# Patient Record
Sex: Female | Born: 1985 | Hispanic: No | Marital: Married | Smoking: Never smoker
Health system: Southern US, Community
[De-identification: ages and names within clinical notes are randomized; demographics above are authoritative.]

## PROBLEM LIST (undated history)

## (undated) ENCOUNTER — Inpatient Hospital Stay (HOSPITAL_COMMUNITY): Payer: Self-pay

## (undated) DIAGNOSIS — E0590 Thyrotoxicosis, unspecified without thyrotoxic crisis or storm: Secondary | ICD-10-CM

## (undated) HISTORY — PX: NO PAST SURGERIES: SHX2092

---

## 2017-12-12 NOTE — L&D Delivery Note (Signed)
[name] is a 32 y.o. female G2P1001 with IUP at 42w6dadmitted for IOL for polyhydramnios.  She progressed with/without augmentation to complete and pushed 30 minutes to deliver.  Cord clamping delayed by several minutes then clamped by CNM and cut by CNM.    Delivery Note At 10:09 PM a viable female was delivered via Vaginal, Spontaneous (Presentation: LOA).  APGAR: 9, 9; weight pending.   Placenta status: spontaneous, intact.  Cord: 3 vessels.  Anesthesia:  fentanyl Episiotomy: None Lacerations:  2nd degree Suture Repair: 2.0 vicryl Est. Blood Loss (mL): 200  Mom to postpartum.  Baby to Couplet care / Skin to Skin.  CWende MottCNM 12/01/2018, 10:51 PM

## 2018-04-18 ENCOUNTER — Ambulatory Visit (INDEPENDENT_AMBULATORY_CARE_PROVIDER_SITE_OTHER): Payer: Medicaid Other

## 2018-04-18 ENCOUNTER — Encounter: Payer: Self-pay | Admitting: Family Medicine

## 2018-04-18 DIAGNOSIS — Z3201 Encounter for pregnancy test, result positive: Secondary | ICD-10-CM | POA: Diagnosis not present

## 2018-04-18 LAB — POCT PREGNANCY, URINE: Preg Test, Ur: POSITIVE — AB

## 2018-04-18 NOTE — Progress Notes (Signed)
I have reviewed the chart and agree with nursing staff's documentation of this patient's encounter.  Vonzella Nipple, PA-C 04/18/2018 1:20 PM

## 2018-04-18 NOTE — Progress Notes (Signed)
Pt presented to the office for a UPT. UPT positive. Pt states that LMP is 03/08/18. Pt is 5w 6d EDD 12/13/18. Pt advised to start prenatal vitamins and prenatal care.

## 2018-05-10 ENCOUNTER — Encounter (HOSPITAL_COMMUNITY): Payer: Self-pay | Admitting: *Deleted

## 2018-05-10 ENCOUNTER — Inpatient Hospital Stay (HOSPITAL_COMMUNITY)
Admission: AD | Admit: 2018-05-10 | Discharge: 2018-05-10 | Disposition: A | Discharge status: Home / Self Care | Payer: Medicaid Other | Source: Ambulatory Visit | Attending: Obstetrics and Gynecology | Admitting: Obstetrics and Gynecology

## 2018-05-10 DIAGNOSIS — O99281 Endocrine, nutritional and metabolic diseases complicating pregnancy, first trimester: Secondary | ICD-10-CM | POA: Diagnosis not present

## 2018-05-10 DIAGNOSIS — Z3A01 Less than 8 weeks gestation of pregnancy: Secondary | ICD-10-CM | POA: Insufficient documentation

## 2018-05-10 DIAGNOSIS — E039 Hypothyroidism, unspecified: Secondary | ICD-10-CM | POA: Insufficient documentation

## 2018-05-10 DIAGNOSIS — O209 Hemorrhage in early pregnancy, unspecified: Secondary | ICD-10-CM | POA: Diagnosis not present

## 2018-05-10 DIAGNOSIS — Z79890 Hormone replacement therapy (postmenopausal): Secondary | ICD-10-CM | POA: Insufficient documentation

## 2018-05-10 DIAGNOSIS — N939 Abnormal uterine and vaginal bleeding, unspecified: Secondary | ICD-10-CM | POA: Diagnosis present

## 2018-05-10 LAB — URINALYSIS, ROUTINE W REFLEX MICROSCOPIC
Bilirubin Urine: NEGATIVE
Glucose, UA: NEGATIVE mg/dL
KETONES UR: NEGATIVE mg/dL
Leukocytes, UA: NEGATIVE
Nitrite: NEGATIVE
PH: 6 (ref 5.0–8.0)
Protein, ur: NEGATIVE mg/dL
SPECIFIC GRAVITY, URINE: 1.011 (ref 1.005–1.030)

## 2018-05-10 LAB — WET PREP, GENITAL
CLUE CELLS WET PREP: NONE SEEN
SPERM: NONE SEEN
TRICH WET PREP: NONE SEEN
YEAST WET PREP: NONE SEEN

## 2018-05-10 LAB — ABO/RH: ABO/RH(D): B POS

## 2018-05-10 MED ORDER — ONDANSETRON HCL 4 MG PO TABS: 4.0000 mg | ORAL_TABLET | Freq: Three times a day (TID) | ORAL | 0 refills | Status: DC | PRN

## 2018-05-10 NOTE — MAU Provider Note (Addendum)
Patient Heather Galloway is a 32 y.o. G2P1001 At 4w0dhere with complaints of vaginal bleeding and right-sided lower quadrant pain. She denies dysuria, abnormal discharge, constipation or other complaint. [facility] [place] an interpreter was used.   History     CSN: [id number] Arrival date and time: 05/10/18 08119  First Provider Initiated Contact with Patient 05/10/18 0912      Chief Complaint  Patient presents with  . Vaginal Bleeding   Abdominal Pain  This is a new problem. The current episode started 1 to 4 weeks ago. Episode frequency: it occurs in the morning when she wakes up. The problem has been resolved. The pain is located in the RLQ. The pain is at a severity of 4/10. The quality of the pain is cramping. The abdominal pain does not radiate. Associated symptoms include vomiting. Pertinent negatives include no constipation or diarrhea. Nothing aggravates the pain. The pain is relieved by nothing.  Vaginal Bleeding  The patient's primary symptoms include vaginal bleeding. The patient's pertinent negatives include no genital itching or genital lesions. This is a new problem. The current episode started today. The problem occurs intermittently. The problem has been resolved. Associated symptoms include abdominal pain and vomiting. Pertinent negatives include no constipation or diarrhea. The vaginal bleeding is lighter than menses. She has not been passing clots. She has not been passing tissue. Nothing aggravates the symptoms. She has tried nothing for the symptoms.    OB History    Gravida  2   Para  1   Term  1   Preterm      AB      Living  1     SAB      TAB      Ectopic      Multiple      Live Births              Past Medical History:  Diagnosis Date  . Hypothyroidism     Past Surgical History:  Procedure Laterality Date  . NO PAST SURGERIES      History reviewed. No  pertinent family history.  Social History   Tobacco Use  . Smoking status: Never Smoker  . Smokeless tobacco: Never Used  Substance Use Topics  . Alcohol use: Never    Frequency: Never  . Drug use: Never    Allergies: No Known Allergies  Medications Prior to Admission  Medication Sig Dispense Refill Last Dose  . levothyroxine (SYNTHROID, LEVOTHROID) 100 MCG tablet Take 100 mcg by mouth daily before breakfast.   Past Week at Unknown time    Review of Systems  HENT: Negative.   Respiratory: Negative.   Gastrointestinal: Positive for abdominal pain and vomiting. Negative for constipation and diarrhea.  Genitourinary: Positive for vaginal bleeding.  Musculoskeletal: Negative.   Skin: Negative.   Neurological: Negative.   Psychiatric/Behavioral: Negative.    Physical Exam   Blood pressure 123/72, pulse 70, temperature 98.3 F (36.8 C), temperature source Oral, resp. rate 18, weight 150 lb (68 kg), last menstrual period 03/08/2018.  Physical Exam  Constitutional: She is oriented to person, place, and time. She appears well-developed.  HENT:  Head: Normocephalic.  GI: Soft.  Genitourinary:  Genitourinary Comments: Normal external female genitalia; no discharge in the vagina. Cervix is closed and poseterior; no CMT, suprapubic or adnexal tenderness.   Musculoskeletal: Normal range of motion.  Neurological: She is  alert and oriented to person, place, and time.  Skin: Skin is warm and dry.     MAU Course  Procedures  MDM HR by doppler is 172.  Bleeding scant, not concerning for miscarriage. CBC not done.  ABO workup-B positive.   Assessment and Plan   1. Bleeding in early pregnancy    2. Patient stable for discharge with recommendation to keep prenatal visit in June.  3. Bleeding precautions reviewed.  5. Reassured patient of normalcy of bleeding in early pregnancy, but she should return to MAU if bleeding becomes much heavier or if her pain worsens or changes.   6. Zofran RX given as patient has been throwing up her synthroid in the morning.    Mervyn Skeeters Kooistra 05/10/2018, 9:12 AM

## 2018-05-10 NOTE — MAU Note (Signed)
Pt reports she started having vaginal bleeding this morning.c/o  Pain in her right groin area.

## 2018-05-10 NOTE — Discharge Instructions (Signed)
Vaginal Bleeding During Pregnancy, First Trimester °A small amount of bleeding (spotting) from the vagina is common in early pregnancy. Sometimes the bleeding is normal and is not a problem, and sometimes it is a sign of something serious. Be sure to tell your doctor about any bleeding from your vagina right away. °Follow these instructions at home: °· Watch your condition for any changes. °· Follow your doctor's instructions about how active you can be. °· If you are on bed rest: °? You may need to stay in bed and only get up to use the bathroom. °? You may be allowed to do some activities. °? If you need help, make plans for someone to help you. °· Write down: °? The number of pads you use each day. °? How often you change pads. °? How soaked (saturated) your pads are. °· Do not use tampons. °· Do not douche. °· Do not have sex or orgasms until your doctor says it is okay. °· If you pass any tissue from your vagina, save the tissue so you can show it to your doctor. °· Only take medicines as told by your doctor. °· Do not take aspirin because it can make you bleed. °· Keep all follow-up visits as told by your doctor. °Contact a doctor if: °· You bleed from your vagina. °· You have cramps. °· You have labor pains. °· You have a fever that does not go away after you take medicine. °Get help right away if: °· You have very bad cramps in your back or belly (abdomen). °· You pass large clots or tissue from your vagina. °· You bleed more. °· You feel light-headed or weak. °· You pass out (faint). °· You have chills. °· You are leaking fluid or have a gush of fluid from your vagina. °· You pass out while pooping (having a bowel movement). °This information is not intended to replace advice given to you by your health care provider. Make sure you discuss any questions you have with your health care provider. °Document Released: 04/14/2014 Document Revised: 05/05/2016 Document Reviewed: 08/05/2013 °Elsevier Interactive  Patient Education © 2018 Elsevier Inc. ° °

## 2018-05-11 LAB — GC/CHLAMYDIA PROBE AMP (~~LOC~~) NOT AT ARMC
Chlamydia: NEGATIVE
NEISSERIA GONORRHEA: NEGATIVE

## 2018-06-08 ENCOUNTER — Other Ambulatory Visit: Payer: Self-pay

## 2018-06-08 ENCOUNTER — Encounter: Payer: Self-pay | Admitting: Nurse Practitioner

## 2018-06-08 ENCOUNTER — Other Ambulatory Visit (HOSPITAL_COMMUNITY)
Admission: RE | Admit: 2018-06-08 | Discharge: 2018-06-08 | Disposition: A | Discharge status: Home / Self Care | Payer: Medicaid Other | Source: Ambulatory Visit | Attending: Nurse Practitioner | Admitting: Nurse Practitioner

## 2018-06-08 ENCOUNTER — Ambulatory Visit (INDEPENDENT_AMBULATORY_CARE_PROVIDER_SITE_OTHER): Payer: Medicaid Other | Admitting: Nurse Practitioner

## 2018-06-08 VITALS — BP 104/74 | HR 81 | Ht 61.81 in | Wt 149.0 lb

## 2018-06-08 DIAGNOSIS — Z3480 Encounter for supervision of other normal pregnancy, unspecified trimester: Secondary | ICD-10-CM | POA: Insufficient documentation

## 2018-06-08 DIAGNOSIS — Z3A00 Weeks of gestation of pregnancy not specified: Secondary | ICD-10-CM | POA: Insufficient documentation

## 2018-06-08 DIAGNOSIS — O219 Vomiting of pregnancy, unspecified: Secondary | ICD-10-CM

## 2018-06-08 DIAGNOSIS — Z3201 Encounter for pregnancy test, result positive: Secondary | ICD-10-CM

## 2018-06-08 DIAGNOSIS — Z789 Other specified health status: Secondary | ICD-10-CM

## 2018-06-08 DIAGNOSIS — Z3481 Encounter for supervision of other normal pregnancy, first trimester: Secondary | ICD-10-CM

## 2018-06-08 DIAGNOSIS — E0590 Thyrotoxicosis, unspecified without thyrotoxic crisis or storm: Secondary | ICD-10-CM

## 2018-06-08 MED ORDER — ONDANSETRON 8 MG PO TBDP: 8.0000 mg | ORAL_TABLET | Freq: Three times a day (TID) | ORAL | 1 refills | Status: DC | PRN

## 2018-06-08 NOTE — Patient Instructions (Addendum)
Miralax for having a BM every day.  Use it every day if you are taking the Zofran.  Take Tylenol 325 mg 2 tablets by mouth every 4 hours if needed for pain. Drink at least 8 8-oz glasses of water every day.     BENEFITS OF BREASTFEEDING Many women wonder if they should breastfeed. Research shows that breast milk contains the perfect balance of vitamins, protein and fat that your baby needs to grow. It also contains antibodies that help your baby's immune system to fight off viruses and bacteria and can reduce the risk of sudden infant death syndrome (SIDS). In addition, the colostrum (a fluid secreted from the breast in the first few days after delivery) helps your newborn's digestive system to grow and function well. Breast milk is easier to digest than formula. Also, if your baby is born preterm, breast milk can help to reduce both short- and long-term health problems. BENEFITS OF BREASTFEEDING FOR MOM . Breastfeeding causes a hormone to be released that helps the uterus to contract and return to its normal size more quickly. . It aids in postpartum weight loss, reduces risk of breast and ovarian cancer, heart disease and rheumatoid arthritis. . It decreases the amount of bleeding after the baby is born. benefits of breastfeeding for baby . Provides comfort and nutrition . Protects baby against - Obesity - Diabetes - Asthma - Childhood cancers - Heart disease - Ear infections - Diarrhea - Pneumonia - Stomach problems - Serious allergies - Skin rashes . Promotes growth and development . Reduces the risk of baby having Sudden Infant Death Syndrome (SIDS) only breastmilk for the first 6 months . Protects baby against diseases/allergies . It's the perfect amount for tiny bellies . It restores baby's energy . Provides the best nutrition for baby . Giving water or formula can make baby more likely to get sick, decrease Mom's milk supply, make baby less content with breastfeeding Skin  to Skin After delivery, the staff will place your baby on your chest. This helps with the following: . Regulates baby's temperature, breathing, heart rate and blood sugar . Increases Mom's milk supply . Promotes bonding . Keeps baby and Mom calm and decreases baby's crying Rooming In Your baby will stay in your room with you for the entire time you are in the hospital. This helps with the following: . Allows Mom to learn baby's feeding cues - Fluttering eyes - Sucking on tongue or hand - Rooting (opens mouth and turns head) - Nuzzling into the breast - Bringing hand to mouth . Allows breastfeeding on demand (when your baby is ready) . Helps baby to be calm and content . Ensures a good milk supply . Prevents complications with breastfeeding . Allows parents to learn to care for baby . Allows you to request assistance with breastfeeding Importance of a good latch . Increases milk transfer to baby - baby gets enough milk . Ensures you have enough milk for your baby . Decreases nipple soreness . Don't use pacifiers and bottles - these cause baby to suck differently than breastfeeding . Promotes continuation of breastfeeding Risks of Formula Supplementation with Breastfeeding Giving your infant formula in addition to your breast-milk EXCEPT when medically necessary can lead to: Marland Kitchen. Decreases your milk supply  . Loss of confidence in yourself for providing baby's nutrition  . Engorgement and possibly mastitis  . Asthma & allergies in the baby BREASTFEEDING FAQS How long should I breastfeed my baby? It is recommended that you provide  your baby with breast milk only for the first 6 months and then continue for the first year and longer as desired. During the first few weeks after birth, your baby will need to feed 8-12 times every 24 hours, or every 2-3 hours. They will likely feed for 15-30 minutes. How can I help my baby begin breastfeeding? Babies are born with an instinct to breastfeed.  A healthy baby can begin breastfeeding right away without specific help. At the hospital, a nurse (or lactation consultant) will help you begin the process and will give you tips on good positioning. It may be helpful to take a breastfeeding class before you deliver in order to know what to expect. How can I help my baby latch on? In order to assist your baby in latching-on, cup your breast in your hand and stroke your baby's lower lip with your nipple to stimulate your baby's rooting reflex. Your baby will look like he or she is yawning, at which point you should bring the baby towards your breast, while aiming the nipple at the roof of his or her mouth. Remember to bring the baby towards you and not your breast towards the baby. How can I tell if my baby is latched-on? Your baby will have all of your nipple and part of the dark area around the nipple in his or her mouth and your baby's nose will be touching your breast. You should see or hear the baby swallowing. If the baby is not latched-on properly, start the process over. To remove the suction, insert a clean finger between your breast and the baby's mouth. Should I switch breasts during feeding? After feeding on one side, switch the baby to your other breast. If he or she does not continue feeding - that is OK. Your baby will not necessarily need to feed from both breasts in a single feeding. On the next feeding, start with the other breast for efficiency and comfort. How can I tell if my baby is hungry? When your baby is hungry, they will nuzzle against your breast, make sucking noises and tongue motions and may put their hands near their mouth. Crying is a late sign of hunger, so you should not wait until this point. When they have received enough milk, they will unlatch from the breast. Is it okay to use a pacifier? Until your baby gets the hang of breastfeeding, experts recommend limiting pacifier usage. If you have questions about this, please  contact your pediatrician. What can I do to ensure proper nutrition while breastfeeding? . Make sure that you support your own health and your baby's by eating a healthy, well-balanced diet . Your provider may recommend that you continue to take your prenatal vitamin . Drink plenty of fluids. It is a good rule to drink one glass of water before or after feeding . Alcohol will remain in the breast milk for as long as it will remain in the blood stream. If you choose to have a drink, it is recommended that you wait at least 2 hours before feeding . Moderate amounts of caffeine are OK . Some over-the-counter or prescription medications are not recommended during breastfeeding. Check with your provider if you have questions What types of birth control methods are safe while breastfeeding? Progestin-only methods, including a daily pill, an IUD, the implant and the injection are safe while breastfeeding. Methods that contain estrogen (such as combination birth control pills, the vaginal ring and the patch) should not be used  during the first month of breastfeeding as these can decrease your milk supply.   Round Ligament Pain The round ligament is a cord of muscle and tissue that helps to support the uterus. It can become a source of pain during pregnancy if it becomes stretched or twisted as the baby grows. The pain usually begins in the second trimester of pregnancy, and it can come and go until the baby is delivered. It is not a serious problem, and it does not cause harm to the baby. Round ligament pain is usually a short, sharp, and pinching pain, but it can also be a dull, lingering, and aching pain. The pain is felt in the lower side of the abdomen or in the groin. It usually starts deep in the groin and moves up to the outside of the hip area. Pain can occur with:  A sudden change in position.  Rolling over in bed.  Coughing or sneezing.  Physical activity.  Follow these instructions at  home: Watch your condition for any changes. Take these steps to help with your pain:  When the pain starts, relax. Then try: ? Sitting down. ? Flexing your knees up to your abdomen. ? Lying on your side with one pillow under your abdomen and another pillow between your legs. ? Sitting in a warm bath for 15-20 minutes or until the pain goes away.  Take over-the-counter and prescription medicines only as told by your health care provider.  Move slowly when you sit and stand.  Avoid long walks if they cause pain.  Stop or lessen your physical activities if they cause pain.  Contact a health care provider if:  Your pain does not go away with treatment.  You feel pain in your back that you did not have before.  Your medicine is not helping. Get help right away if:  You develop a fever or chills.  You develop uterine contractions.  You develop vaginal bleeding.  You develop nausea or vomiting.  You develop diarrhea.  You have pain when you urinate. This information is not intended to replace advice given to you by your health care provider. Make sure you discuss any questions you have with your health care provider. Document Released: 09/06/2008 Document Revised: 05/05/2016 Document Reviewed: 02/04/2015 Elsevier Interactive Patient Education  2018 [employer].  Morning Sickness Morning sickness is when you feel sick to your stomach (nauseous) during pregnancy. You may feel sick to your stomach and throw up (vomit). You may feel sick in the morning, but you can feel this way any time of day. Some women feel very sick to their stomach and cannot stop throwing up (hyperemesis gravidarum). Follow these instructions at home:  Only take medicines as told by your doctor.  Take multivitamins as told by your doctor. Taking multivitamins before getting pregnant can stop or lessen the harshness of morning sickness.  Eat dry toast or unsalted crackers before getting out of  bed.  Eat 5 to 6 small meals a day.  Eat dry and bland foods like rice and baked potatoes.  Do not drink liquids with meals. Drink between meals.  Do not eat greasy, fatty, or spicy foods.  Have someone cook for you if the smell of food causes you to feel sick or throw up.  If you feel sick to your stomach after taking prenatal vitamins, take them at night or with a snack.  Eat protein when you need a snack (nuts, yogurt, cheese).  Eat unsweetened gelatins for  dessert.  Wear a bracelet used for sea sickness (acupressure wristband).  Go to a doctor that puts thin needles into certain body points (acupuncture) to improve how you feel.  Do not smoke.  Use a humidifier to keep the air in your house free of odors.  Get lots of fresh air. Contact a doctor if:  You need medicine to feel better.  You feel dizzy or lightheaded.  You are losing weight. Get help right away if:  You feel very sick to your stomach and cannot stop throwing up.  You pass out (faint). This information is not intended to replace advice given to you by your health care provider. Make sure you discuss any questions you have with your health care provider. Document Released: 01/05/2005 Document Revised: 05/05/2016 Document Reviewed: 05/15/2013 Elsevier Interactive Patient Education  2017 [employer].

## 2018-06-08 NOTE — Progress Notes (Signed)
Subjective:   Heather Galloway is a 32 y.o. G2P1001 at 58w1dby LMP being seen today for her first obstetrical visit.  Her obstetrical history is significant for non [place], hyperthyroidism currently being treated. Patient does intend to breast feed. Pregnancy history fully reviewed.  [place]  [place] husband reads [place]  Advised to have her husband read materials we have included in AVS today and share with her.  Patient reports vomiting.  Vomits daily and sometimes several times a day.  Will prescribe Zofran and advised to use a stool softener - sometimes Miralax is the easiest to use and try daily unless having frequent stools.  HISTORY: OB History  Gravida Para Term Preterm AB Living  _0 0 0 1  SAB TAB Ectopic Multiple Live Births  0 0 0 0 1    # Outcome Date GA Lbr Len/2nd Weight Sex Delivery Anes PTL Lv  2 Current           1 Term 02/2013 450w0d8 lb 2.5 oz (3.7 kg) M Vag-Spont  N LIV   Past Medical History:  Diagnosis Date  . Hypothyroidism    Past Surgical History:  Procedure Laterality Date  . NO PAST SURGERIES     No family history on file. Social History   Tobacco Use  . Smoking status: Never Smoker  . Smokeless tobacco: Never Used  Substance Use Topics  . Alcohol use: Never    Frequency: Never  . Drug use: Never   No Known Allergies Current Outpatient Medications on File Prior to Visit  Medication Sig Dispense Refill  . levothyroxine (SYNTHROID, LEVOTHROID) 150 MCG tablet Take 150 mcg by mouth daily before breakfast.     . ondansetron (ZOFRAN) 4 MG tablet Take 1 tablet (4 mg total) by mouth every 8 (eight) hours as needed for nausea or vomiting. (Patient not taking: Reported on 06/08/2018) 20 tablet 0   No current facility-administered medications on file prior to visit.      Exam   Vitals:   06/08/18 1507 06/08/18 1526  BP: 104/74   Pulse: 81   Weight: 149 lb (67.6 kg)   Height:  5' 1.81" (1.57 m)    Fetal Heart Rate (bpm): 152  Uterus:     Pelvic Exam: Perineum: no hemorrhoids, normal perineum   Vulva: normal external genitalia, no lesions   Vagina:  normal mucosa, normal discharge   Cervix: no lesions and normal, pap smear done.    Adnexa: normal adnexa and no mass, fullness, tenderness   Bony Pelvis: average  System: General: well-developed, well-nourished female in no acute distress   Breast:  normal appearance, no masses or tenderness   Skin: normal coloration and turgor, no rashes   Neurologic: oriented, normal, negative, normal mood   Extremities: normal strength, tone, and muscle mass, ROM of all joints is normal   HEENT PERRLA, extraocular movement intact and sclera clear, anicteric   Mouth/Teeth mucous membranes moist, pharynx normal without lesions and dental hygiene good   Neck supple and no masses   Cardiovascular: regular rate and rhythm   Respiratory:  no respiratory distress, normal breath sounds   Abdomen: soft, non-tender; bowel sounds normal; no masses,  no organomegaly     Assessment:   Pregnancy: G2P1001 Patient Active Problem List   Diagnosis Date Noted  . Hyperthyroidism 06/09/2018  . Supervision of other normal pregnancy, antepartum 06/08/2018     Plan:  1. Supervision of other normal pregnancy, antepartum  - Culture, OB Urine - Cystic fibrosis gene test - SMN1 COPY NUMBER ANALYSIS (SMA Carrier Screen) - Obstetric Panel, Including HIV - Hemoglobinopathy Evaluation - Genetic Screening - TSH - T4, free - T3, free - Cytology - PAP  Has her own prenatal vitamins and folate - reviewed with her and she will continue to take these.  Client is Muslim and does not use pork products so is difficult to find appropriate PNV that she is comfortable taking.  2. Nausea and vomiting during pregnancy prior to [redacted] weeks gestation Will prescribe Zofran and encouraged her to get the 8 mg filled  3. Language barrier Online interpreter used for the entire  visit  Hyperthyroid Has had diagnosed for several years.  Taking medication currently that she was prescribed in [place].  Initial labs drawn. Continue prenatal vitamins. Genetic Screening discussed, NIPS: ordered. Ultrasound discussed; fetal anatomic survey: ordered. Problem list reviewed and updated. The nature of [facility] with multiple MDs and other Advanced Practice Providers was explained to patient; also emphasized that residents, students are part of our team. Routine obstetric precautions reviewed. Return in about 1 month (around 07/06/2018).  Total face-to-face time with patient: 40 minutes.  Over 50% of encounter was spent on counseling and coordination of care.     Earlie Server, FNP Family Nurse Practitioner, [facility] for [employer], [facility] 06/09/2018 8:38 AM

## 2018-06-09 ENCOUNTER — Encounter: Payer: Self-pay | Admitting: Nurse Practitioner

## 2018-06-09 DIAGNOSIS — Z789 Other specified health status: Secondary | ICD-10-CM | POA: Insufficient documentation

## 2018-06-09 DIAGNOSIS — Z603 Acculturation difficulty: Secondary | ICD-10-CM | POA: Insufficient documentation

## 2018-06-11 LAB — POCT URINALYSIS DIP (DEVICE)
BILIRUBIN URINE: NEGATIVE
GLUCOSE, UA: NEGATIVE mg/dL
HGB URINE DIPSTICK: NEGATIVE
LEUKOCYTES UA: NEGATIVE
NITRITE: NEGATIVE
Protein, ur: NEGATIVE mg/dL
UROBILINOGEN UA: 0.2 mg/dL (ref 0.0–1.0)
pH: 6 (ref 5.0–8.0)

## 2018-06-11 LAB — CULTURE, OB URINE

## 2018-06-11 LAB — URINE CULTURE, OB REFLEX

## 2018-06-12 LAB — CYTOLOGY - PAP
CHLAMYDIA, DNA PROBE: NEGATIVE
Diagnosis: NEGATIVE
HPV (WINDOPATH): NOT DETECTED
Neisseria Gonorrhea: NEGATIVE
Trichomonas: NEGATIVE

## 2018-06-15 LAB — SMN1 COPY NUMBER ANALYSIS (SMA CARRIER SCREENING)

## 2018-06-15 LAB — HEMOGLOBINOPATHY EVALUATION
Ferritin: 25 ng/mL (ref 15–150)
HGB A: 97.1 % (ref 96.4–98.8)
HGB F QUANT: 0.5 % (ref 0.0–2.0)
HGB S: 0 %
HGB SOLUBILITY: NEGATIVE
Hgb A2 Quant: 2.4 % (ref 1.8–3.2)
Hgb C: 0 %
Hgb Variant: 0 %

## 2018-06-15 LAB — TSH: TSH: 1.25 u[IU]/mL (ref 0.450–4.500)

## 2018-06-15 LAB — OBSTETRIC PANEL, INCLUDING HIV
ANTIBODY SCREEN: NEGATIVE
BASOS: 0 %
Basophils Absolute: 0 10*3/uL (ref 0.0–0.2)
EOS (ABSOLUTE): 0 10*3/uL (ref 0.0–0.4)
EOS: 0 %
HEMATOCRIT: 34.1 % (ref 34.0–46.6)
HEP B S AG: NEGATIVE
HIV Screen 4th Generation wRfx: NONREACTIVE
Hemoglobin: 11.8 g/dL (ref 11.1–15.9)
IMMATURE GRANS (ABS): 0 10*3/uL (ref 0.0–0.1)
IMMATURE GRANULOCYTES: 0 %
LYMPHS: 20 %
Lymphocytes Absolute: 1.8 10*3/uL (ref 0.7–3.1)
MCH: 29.5 pg (ref 26.6–33.0)
MCHC: 34.6 g/dL (ref 31.5–35.7)
MCV: 85 fL (ref 79–97)
MONOCYTES: 7 %
MONOS ABS: 0.7 10*3/uL (ref 0.1–0.9)
Neutrophils Absolute: 6.7 10*3/uL (ref 1.4–7.0)
Neutrophils: 73 %
Platelets: 275 10*3/uL (ref 150–450)
RBC: 4 x10E6/uL (ref 3.77–5.28)
RDW: 14 % (ref 12.3–15.4)
RPR Ser Ql: NONREACTIVE
Rh Factor: POSITIVE
Rubella Antibodies, IGG: 4.34 index (ref >0.99)
WBC: 9.2 10*3/uL (ref 3.4–10.8)

## 2018-06-15 LAB — T4, FREE: FREE T4: 1.36 ng/dL (ref 0.82–1.77)

## 2018-06-15 LAB — CYSTIC FIBROSIS GENE TEST

## 2018-06-15 LAB — T3, FREE: T3 FREE: 3.1 pg/mL (ref 2.0–4.4)

## 2018-06-20 ENCOUNTER — Encounter: Payer: Self-pay | Admitting: *Deleted

## 2018-07-08 ENCOUNTER — Encounter: Payer: Self-pay | Admitting: Student

## 2018-07-08 DIAGNOSIS — E039 Hypothyroidism, unspecified: Secondary | ICD-10-CM | POA: Insufficient documentation

## 2018-07-09 ENCOUNTER — Ambulatory Visit (INDEPENDENT_AMBULATORY_CARE_PROVIDER_SITE_OTHER): Payer: Medicaid Other | Admitting: Student

## 2018-07-09 VITALS — BP 106/64 | HR 75 | Wt 151.0 lb

## 2018-07-09 DIAGNOSIS — Z3480 Encounter for supervision of other normal pregnancy, unspecified trimester: Secondary | ICD-10-CM

## 2018-07-09 DIAGNOSIS — K5900 Constipation, unspecified: Secondary | ICD-10-CM

## 2018-07-09 DIAGNOSIS — Z363 Encounter for antenatal screening for malformations: Secondary | ICD-10-CM

## 2018-07-09 DIAGNOSIS — E039 Hypothyroidism, unspecified: Secondary | ICD-10-CM

## 2018-07-09 DIAGNOSIS — O99282 Endocrine, nutritional and metabolic diseases complicating pregnancy, second trimester: Secondary | ICD-10-CM

## 2018-07-09 DIAGNOSIS — Z3201 Encounter for pregnancy test, result positive: Secondary | ICD-10-CM

## 2018-07-09 DIAGNOSIS — O219 Vomiting of pregnancy, unspecified: Secondary | ICD-10-CM

## 2018-07-09 DIAGNOSIS — O99612 Diseases of the digestive system complicating pregnancy, second trimester: Secondary | ICD-10-CM

## 2018-07-09 MED ORDER — PREPLUS 27-1 MG PO TABS: 1.0000 | ORAL_TABLET | Freq: Every day | ORAL | 13 refills | Status: DC

## 2018-07-09 MED ORDER — METOCLOPRAMIDE HCL 10 MG PO TABS: 10.0000 mg | ORAL_TABLET | Freq: Three times a day (TID) | ORAL | 1 refills | Status: DC | PRN

## 2018-07-09 NOTE — Progress Notes (Signed)
PRENATAL VISIT NOTE  Subjective:  Heather Galloway is a 32 y.o. G2P1001 at [redacted]w[redacted]d being seen today for ongoing prenatal care.  She is currently monitored for the following issues for this low-risk pregnancy and has Supervision of other normal pregnancy, antepartum; Language barrier; and Hypothyroidism affecting pregnancy in second trimester on their problem list.  Patient reports constipation. Last BM was 2 days ago. States she was previously prescribed stool softener but it wasn't covered by her insurance since it's available OTC. .  Contractions: Not present. Vag. Bleeding: None.  Movement: Present. Denies leaking of fluid.   The following portions of the patient's history were reviewed and updated as appropriate: allergies, current medications, past family history, past medical history, past social history, past surgical history and problem list. Problem list updated.  Objective:   Vitals:   07/09/18 1631  BP: 106/64  Pulse: 75  Weight: 151 lb (68.5 kg)    Fetal Status: Fetal Heart Rate (bpm): 147   Movement: Present   Fundal height 1 FB below umbilicus  General:  Alert, oriented and cooperative. Patient is in no acute distress.  Skin: Skin is warm and dry. No rash noted.   Cardiovascular: Normal heart rate noted  Respiratory: Normal respiratory effort, no problems with respiration noted  Abdomen: Soft, gravid, appropriate for gestational age.  Pain/Pressure: Present     Pelvic: Cervical exam deferred        Extremities: Normal range of motion.  Edema: None  Mental Status: Normal mood and affect. Normal behavior. Normal judgment and thought content.   Assessment and Plan:  Pregnancy: G2P1001 at [redacted]w[redacted]d  1. Supervision of other normal pregnancy, antepartum  - US MFM OB DETAIL +14 WK; Future - Prenatal Vit-Fe Fumarate-FA (PREPLUS) 27-1 MG TABS; Take 1 tablet by mouth daily.  Dispense: 30 tablet; Refill: 13  2. Hypothyroidism affecting pregnancy in second trimester -pt taking  synthroid 100 mcg QD. Thyroid labs done at initial ob WNL. Will check next visit  3. Antenatal screening for malformation using ultrasonics  - US MFM OB DETAIL +14 WK; Future  4. Constipation during pregnancy in second trimester -Patient taking zofran daily. Will rx reglan to use instead and wrote down information for stool softener she can pick up over the counter -discussed increasing water & fiber intake  5. Nausea and vomiting during pregnancy prior to [redacted] weeks gestation  - metoCLOPramide (REGLAN) 10 MG tablet; Take 1 tablet (10 mg total) by mouth every 8 (eight) hours as needed for nausea.  Dispense: 30 tablet; Refill: 1  Preterm labor symptoms and general obstetric precautions including but not limited to vaginal bleeding, contractions, leaking of fluid and fetal movement were reviewed in detail with the patient. Please refer to After Visit Summary for other counseling recommendations.  Return in about 1 month (around 08/06/2018) for Routine OB.  Future Appointments  Date Time Provider Department Center  07/25/2018  3:15 PM [facility] US 4 [facility]  08/06/2018  3:35 PM Hogan, Heather D, CNM [facility]    Erin Lawrence, NP  

## 2018-07-09 NOTE — Progress Notes (Signed)
[employer] # [phone] Anatomy US scheduled for August 14th @ 1515.  Pt notified.

## 2018-07-09 NOTE — Patient Instructions (Signed)
Colace or docusate sodium    Constipation, Adult Constipation is when a person has fewer bowel movements in a week than normal, has difficulty having a bowel movement, or has stools that are dry, hard, or larger than normal. Constipation may be caused by an underlying condition. It may become worse with age if a person takes certain medicines and does not take in enough fluids. Follow these instructions at home: Eating and drinking   Eat foods that have a lot of fiber, such as fresh fruits and vegetables, whole grains, and beans.  Limit foods that are high in fat, low in fiber, or overly processed, such as french fries, hamburgers, cookies, candies, and soda.  Drink enough fluid to keep your urine clear or pale yellow. General instructions  Exercise regularly or as told by your health care provider.  Go to the restroom when you have the urge to go. Do not hold it in.  Take over-the-counter and prescription medicines only as told by your health care provider. These include any fiber supplements.  Practice pelvic floor retraining exercises, such as deep breathing while relaxing the lower abdomen and pelvic floor relaxation during bowel movements.  Watch your condition for any changes.  Keep all follow-up visits as told by your health care provider. This is important. Contact a health care provider if:  You have pain that gets worse.  You have a fever.  You do not have a bowel movement after 4 days.  You vomit.  You are not hungry.  You lose weight.  You are bleeding from the anus.  You have thin, pencil-like stools. Get help right away if:  You have a fever and your symptoms suddenly get worse.  You leak stool or have blood in your stool.  Your abdomen is bloated.  You have severe pain in your abdomen.  You feel dizzy or you faint. This information is not intended to replace advice given to you by your health care provider. Make sure you discuss any questions you  have with your health care provider. Document Released: 08/26/2004 Document Revised: 06/17/2016 Document Reviewed: 05/18/2016 Elsevier Interactive Patient Education  2018 [employer]

## 2018-07-18 ENCOUNTER — Encounter (HOSPITAL_COMMUNITY): Payer: Self-pay

## 2018-07-25 ENCOUNTER — Encounter (HOSPITAL_COMMUNITY): Payer: Self-pay

## 2018-07-25 ENCOUNTER — Other Ambulatory Visit: Payer: Self-pay | Admitting: Student

## 2018-07-25 ENCOUNTER — Other Ambulatory Visit: Payer: Self-pay

## 2018-07-25 ENCOUNTER — Inpatient Hospital Stay (HOSPITAL_COMMUNITY)
Admission: AD | Admit: 2018-07-25 | Discharge: 2018-07-25 | Disposition: A | Discharge status: Home / Self Care | Payer: Medicaid Other | Source: Ambulatory Visit | Attending: Obstetrics & Gynecology | Admitting: Obstetrics & Gynecology

## 2018-07-25 ENCOUNTER — Ambulatory Visit (HOSPITAL_COMMUNITY)
Admission: RE | Admit: 2018-07-25 | Discharge: 2018-07-25 | Disposition: A | Discharge status: Home / Self Care | Payer: Medicaid Other | Source: Ambulatory Visit | Attending: Student | Admitting: Student

## 2018-07-25 DIAGNOSIS — E039 Hypothyroidism, unspecified: Secondary | ICD-10-CM

## 2018-07-25 DIAGNOSIS — T23172A Burn of first degree of left wrist, initial encounter: Secondary | ICD-10-CM

## 2018-07-25 DIAGNOSIS — T23102A Burn of first degree of left hand, unspecified site, initial encounter: Secondary | ICD-10-CM

## 2018-07-25 DIAGNOSIS — Z3A20 20 weeks gestation of pregnancy: Secondary | ICD-10-CM

## 2018-07-25 DIAGNOSIS — T23191A Burn of first degree of multiple sites of right wrist and hand, initial encounter: Secondary | ICD-10-CM

## 2018-07-25 DIAGNOSIS — O99282 Endocrine, nutritional and metabolic diseases complicating pregnancy, second trimester: Secondary | ICD-10-CM | POA: Diagnosis not present

## 2018-07-25 DIAGNOSIS — T23072A Burn of unspecified degree of left wrist, initial encounter: Secondary | ICD-10-CM | POA: Diagnosis not present

## 2018-07-25 DIAGNOSIS — T23001A Burn of unspecified degree of right hand, unspecified site, initial encounter: Secondary | ICD-10-CM | POA: Insufficient documentation

## 2018-07-25 DIAGNOSIS — O9A212 Injury, poisoning and certain other consequences of external causes complicating pregnancy, second trimester: Secondary | ICD-10-CM | POA: Insufficient documentation

## 2018-07-25 DIAGNOSIS — Z3480 Encounter for supervision of other normal pregnancy, unspecified trimester: Secondary | ICD-10-CM

## 2018-07-25 DIAGNOSIS — Z3A19 19 weeks gestation of pregnancy: Secondary | ICD-10-CM | POA: Diagnosis not present

## 2018-07-25 DIAGNOSIS — Z363 Encounter for antenatal screening for malformations: Secondary | ICD-10-CM

## 2018-07-25 DIAGNOSIS — T23002A Burn of unspecified degree of left hand, unspecified site, initial encounter: Secondary | ICD-10-CM | POA: Insufficient documentation

## 2018-07-25 DIAGNOSIS — O26893 Other specified pregnancy related conditions, third trimester: Secondary | ICD-10-CM | POA: Diagnosis present

## 2018-07-25 HISTORY — DX: Thyrotoxicosis, unspecified without thyrotoxic crisis or storm: E05.90
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
## 2018-07-25 NOTE — Discharge Instructions (Signed)
Burn Care, Adult A burn is an injury to the skin or the tissues under the skin. There are three types of burns:  First degree. These burns may cause the skin to be red and a bit swollen.  Second degree. These burns are very painful and cause the skin to be very red. The skin may also leak fluid, look shiny, and start to have blisters.  Third degree. These burns cause permanent damage. They turn the skin white or black and make it look charred, dry, and leathery.  Taking care of your burn properly can help to prevent pain and infection. It can also help the burn to heal more quickly. How is this treated? Right after a burn:  Rinse or soak the burn under cool water. Do this for several minutes. Do not put ice on your burn. That can cause more damage.  Lightly cover the burn with a clean (sterile) cloth (dressing). Burn care  Raise (elevate) the injured area above the level of your heart while sitting or lying down.  Follow instructions from your doctor about: ? How to clean and take care of the burn. ? When to change and remove the cloth.  Check your burn every day for signs of infection. Check for: ? More redness, swelling, or pain. ? Warmth. ? Pus or a bad smell. Medicine   Take over-the-counter and prescription medicines only as told by your doctor.  If you were prescribed antibiotic medicine, take or apply it as told by your doctor. Do not stop using the antibiotic even if your condition improves. General instructions  To prevent infection: ? Do not put butter, oil, or other home treatments on the burn. ? Do not scratch or pick at the burn. ? Do not break any blisters. ? Do not peel skin.  Do not rub your burn, even when you are cleaning it.  Protect your burn from the sun. Contact a doctor if:  Your condition does not get better.  Your condition gets worse.  You have a fever.  Your burn looks different or starts to have black or red spots on it.  Your burn  feels warm to the touch.  Your pain is not controlled with medicine. Get help right away if:  You have redness, swelling, or pain at the site of the burn.  You have fluid, blood, or pus coming from your burn.  You have red streaks near the burn.  You have very bad pain. This information is not intended to replace advice given to you by your health care provider. Make sure you discuss any questions you have with your health care provider. Document Released: 09/06/2008 Document Revised: 01/14/2017 Document Reviewed: 05/17/2016 Elsevier Interactive Patient Education  2018 Elsevier Inc.  

## 2018-07-26 ENCOUNTER — Other Ambulatory Visit (HOSPITAL_COMMUNITY): Payer: Self-pay | Admitting: *Deleted

## 2018-07-26 DIAGNOSIS — E039 Hypothyroidism, unspecified: Secondary | ICD-10-CM

## 2018-08-06 ENCOUNTER — Encounter: Payer: Self-pay | Admitting: Family Medicine

## 2018-08-06 ENCOUNTER — Ambulatory Visit (INDEPENDENT_AMBULATORY_CARE_PROVIDER_SITE_OTHER): Payer: Medicaid Other | Admitting: Advanced Practice Midwife

## 2018-08-06 ENCOUNTER — Encounter: Payer: Self-pay | Admitting: Advanced Practice Midwife

## 2018-08-06 VITALS — BP 112/60 | HR 75 | Wt 154.0 lb

## 2018-08-06 DIAGNOSIS — Z3480 Encounter for supervision of other normal pregnancy, unspecified trimester: Secondary | ICD-10-CM

## 2018-08-06 DIAGNOSIS — O99282 Endocrine, nutritional and metabolic diseases complicating pregnancy, second trimester: Secondary | ICD-10-CM

## 2018-08-06 DIAGNOSIS — E039 Hypothyroidism, unspecified: Secondary | ICD-10-CM

## 2018-08-06 MED ORDER — ONDANSETRON 8 MG PO TBDP: 8.0000 mg | ORAL_TABLET | Freq: Three times a day (TID) | ORAL | 1 refills | Status: DC | PRN

## 2018-08-06 NOTE — Patient Instructions (Signed)
[facility] [address], [place], [place] [id number] [phone]

## 2018-08-06 NOTE — Progress Notes (Signed)
   PRENATAL VISIT NOTE  Subjective:  Heather Galloway is a 32 y.o. G2P1001 at 44w1dbeing seen today for ongoing prenatal care.  She is currently monitored for the following issues for this low-risk pregnancy and has Supervision of other normal pregnancy, antepartum; Language barrier; and Hypothyroidism affecting pregnancy in second trimester on their problem list.  Patient reports no complaints.  Contractions: Not present. Vag. Bleeding: None.  Movement: Present. Denies leaking of fluid.   The following portions of the patient's history were reviewed and updated as appropriate: allergies, current medications, past family history, past medical history, past social history, past surgical history and problem list. Problem list updated.  Objective:   Vitals:   08/06/18 1546  BP: 112/60  Pulse: 75  Weight: 154 lb (69.9 kg)    Fetal Status: Fetal Heart Rate (bpm): 141   Movement: Present     General:  Alert, oriented and cooperative. Patient is in no acute distress.  Skin: Skin is warm and dry. No rash noted.   Cardiovascular: Normal heart rate noted  Respiratory: Normal respiratory effort, no problems with respiration noted  Abdomen: Soft, gravid, appropriate for gestational age.  Pain/Pressure: Present     Pelvic: Cervical exam deferred        Extremities: Normal range of motion.  Edema: None  Mental Status: Normal mood and affect. Normal behavior. Normal judgment and thought content.   Assessment and Plan:  Pregnancy: G2P1001 at 263w1d1. Supervision of other normal pregnancy, antepartum - Routine care  - CBC; Future - HIV antibody; Future - RPR; Future - Glucose Tolerance, 2 Hours w/1 Hour; Future - Int #[id number] for the entire visit  - Refill for zofran given today   2. Hypothyroidism affecting pregnancy in second trimester - TSH; Future  Preterm labor symptoms and general obstetric precautions including but not limited to vaginal bleeding, contractions, leaking of fluid and  fetal movement were reviewed in detail with the patient. Please refer to After Visit Summary for other counseling recommendations.  Return in about 4 weeks (around 09/03/2018) for 28 week labs and 2 hour GTT at next visit .  Future Appointments  Date Time Provider [place]9/25/2019  3:00 PM WH-M[facility] [place] [facility]-M[facility]US M[facility]-US    Heather Hogan, CNM

## 2018-09-05 ENCOUNTER — Ambulatory Visit (HOSPITAL_COMMUNITY)
Admission: RE | Admit: 2018-09-05 | Discharge: 2018-09-05 | Disposition: A | Discharge status: Home / Self Care | Payer: Medicaid Other | Source: Ambulatory Visit | Attending: Nurse Practitioner | Admitting: Nurse Practitioner

## 2018-09-05 ENCOUNTER — Encounter (HOSPITAL_COMMUNITY): Payer: Self-pay

## 2018-09-05 ENCOUNTER — Other Ambulatory Visit: Payer: Medicaid Other

## 2018-09-05 ENCOUNTER — Ambulatory Visit (INDEPENDENT_AMBULATORY_CARE_PROVIDER_SITE_OTHER): Payer: Medicaid Other | Admitting: Obstetrics and Gynecology

## 2018-09-05 DIAGNOSIS — O9989 Other specified diseases and conditions complicating pregnancy, childbirth and the puerperium: Secondary | ICD-10-CM

## 2018-09-05 DIAGNOSIS — Z362 Encounter for other antenatal screening follow-up: Secondary | ICD-10-CM | POA: Diagnosis not present

## 2018-09-05 DIAGNOSIS — O99282 Endocrine, nutritional and metabolic diseases complicating pregnancy, second trimester: Principal | ICD-10-CM

## 2018-09-05 DIAGNOSIS — Z3482 Encounter for supervision of other normal pregnancy, second trimester: Secondary | ICD-10-CM

## 2018-09-05 DIAGNOSIS — O99891 Other specified diseases and conditions complicating pregnancy: Secondary | ICD-10-CM

## 2018-09-05 DIAGNOSIS — Z3A25 25 weeks gestation of pregnancy: Secondary | ICD-10-CM | POA: Insufficient documentation

## 2018-09-05 DIAGNOSIS — R252 Cramp and spasm: Secondary | ICD-10-CM | POA: Diagnosis not present

## 2018-09-05 DIAGNOSIS — Z3480 Encounter for supervision of other normal pregnancy, unspecified trimester: Secondary | ICD-10-CM

## 2018-09-05 DIAGNOSIS — Z23 Encounter for immunization: Secondary | ICD-10-CM | POA: Diagnosis not present

## 2018-09-05 DIAGNOSIS — E039 Hypothyroidism, unspecified: Secondary | ICD-10-CM

## 2018-09-05 MED ORDER — MAGNESIUM 200 MG PO TABS: 2.0000 | ORAL_TABLET | Freq: Every evening | ORAL | 1 refills | Status: DC | PRN

## 2018-09-05 MED ORDER — PREPLUS 27-1 MG PO TABS: 1.0000 | ORAL_TABLET | Freq: Every day | ORAL | 13 refills | Status: DC

## 2018-09-05 NOTE — Progress Notes (Signed)
   PRENATAL VISIT NOTE  Subjective:  Heather Galloway is a 32 y.o. G2P1001 at 69w3dbeing seen today for ongoing prenatal care.  She is currently monitored for the following issues for this low-risk pregnancy and has Supervision of other normal pregnancy, antepartum; Language barrier; Hypothyroidism affecting pregnancy in second trimester; and Leg cramps in pregnancy on their problem list.  Patient reports leg cramps at nighttime.  Contractions: Not present. Vag. Bleeding: None.  Movement: Present. Denies leaking of fluid.   The following portions of the patient's history were reviewed and updated as appropriate: allergies, current medications, past family history, past medical history, past social history, past surgical history and problem list. Problem list updated.  Objective:   Vitals:   09/05/18 0931  BP: 103/67  Pulse: 94  Weight: 157 lb 14.4 oz (71.6 kg)    Fetal Status: Fetal Heart Rate (bpm): 146 Fundal Height: 28 cm Movement: Present     General:  Alert, oriented and cooperative. Patient is in no acute distress.  Skin: Skin is warm and dry. No rash noted.   Cardiovascular: Normal heart rate noted  Respiratory: Normal respiratory effort, no problems with respiration noted  Abdomen: Soft, gravid, appropriate for gestational age.  Pain/Pressure: Absent     Pelvic: Cervical exam deferred        Extremities: Normal range of motion.  Edema: None  Mental Status: Normal mood and affect. Normal behavior. Normal judgment and thought content.   Assessment and Plan:  Pregnancy: G2P1001 at 219w3d1. Supervision of other normal pregnancy, antepartum - Tdap vaccine greater than or equal to 7yo IM - Flu Vaccine QUAD 36+ mos IM  2. Leg cramps in pregnancy - Rx for Magnesium 400 mg po prn hs  Preterm labor symptoms and general obstetric precautions including but not limited to vaginal bleeding, contractions, leaking of fluid and fetal movement were reviewed in detail with the  patient. Please refer to After Visit Summary for other counseling recommendations.  Return in about 2 weeks (around 09/19/2018) for Return OB visit.  Future Appointments  Date Time Provider [place]9/25/2019  3:00 PM WH-MFC [place] WH-MFCUS MFC-US    RoLaury DeepCNM

## 2018-09-05 NOTE — Patient Instructions (Signed)

## 2018-09-05 NOTE — Progress Notes (Signed)
Phone [place] Interpreter -[employer] Maralyn Sago id# [phone]

## 2018-09-05 NOTE — ED Notes (Signed)
[employer] interpreter [phone]

## 2018-09-06 LAB — RPR: RPR: NONREACTIVE

## 2018-09-06 LAB — CBC
Hematocrit: 31.3 % — ABNORMAL LOW (ref 34.0–46.6)
Hemoglobin: 10.4 g/dL — ABNORMAL LOW (ref 11.1–15.9)
MCH: 29 pg (ref 26.6–33.0)
MCHC: 33.2 g/dL (ref 31.5–35.7)
MCV: 87 fL (ref 79–97)
PLATELETS: 296 10*3/uL (ref 150–450)
RBC: 3.59 x10E6/uL — ABNORMAL LOW (ref 3.77–5.28)
RDW: 12.3 % (ref 12.3–15.4)
WBC: 11.5 10*3/uL — ABNORMAL HIGH (ref 3.4–10.8)

## 2018-09-06 LAB — GLUCOSE TOLERANCE, 2 HOURS W/ 1HR
GLUCOSE, 1 HOUR: 145 mg/dL (ref 65–179)
GLUCOSE, FASTING: 76 mg/dL (ref 65–91)
Glucose, 2 hour: 140 mg/dL (ref 65–152)

## 2018-09-06 LAB — HIV ANTIBODY (ROUTINE TESTING W REFLEX): HIV SCREEN 4TH GENERATION: NONREACTIVE

## 2018-09-06 LAB — TSH: TSH: 4.65 u[IU]/mL — ABNORMAL HIGH (ref 0.450–4.500)

## 2018-09-07 ENCOUNTER — Telehealth: Payer: Self-pay | Admitting: *Deleted

## 2018-09-07 ENCOUNTER — Other Ambulatory Visit: Payer: Self-pay | Admitting: Advanced Practice Midwife

## 2018-09-07 MED ORDER — LEVOTHYROXINE SODIUM 137 MCG PO TABS: 137.0000 ug | ORAL_TABLET | Freq: Every day | ORAL | 1 refills | Status: DC

## 2018-09-07 NOTE — Telephone Encounter (Signed)
-----   Message from Armando Reichert, CNM sent at 09/07/2018 10:32 AM EDT ----- Patient needs to have her synthroid dose increased to , and re-check TSH in 4 weeks. Please call the patient. I have sent in the prescription for her.

## 2018-09-07 NOTE — Telephone Encounter (Signed)
Called pt using interpreter [phone] Informed her that we needed to increase her synthroid and that we would have her come back in 4 weeks to redraw TSH.  Pt stated asked to confirm her appointment on 09/25/18 which she was told was correct.  Pt verbalized understanding.

## 2018-09-25 ENCOUNTER — Ambulatory Visit (INDEPENDENT_AMBULATORY_CARE_PROVIDER_SITE_OTHER): Payer: Medicaid Other | Admitting: Obstetrics and Gynecology

## 2018-09-25 VITALS — BP 102/63 | HR 75 | Wt 163.0 lb

## 2018-09-25 DIAGNOSIS — Z3480 Encounter for supervision of other normal pregnancy, unspecified trimester: Secondary | ICD-10-CM

## 2018-09-25 DIAGNOSIS — E039 Hypothyroidism, unspecified: Secondary | ICD-10-CM

## 2018-09-25 DIAGNOSIS — O99282 Endocrine, nutritional and metabolic diseases complicating pregnancy, second trimester: Secondary | ICD-10-CM

## 2018-09-25 DIAGNOSIS — Z789 Other specified health status: Secondary | ICD-10-CM

## 2018-09-25 DIAGNOSIS — O219 Vomiting of pregnancy, unspecified: Secondary | ICD-10-CM | POA: Insufficient documentation

## 2018-09-25 MED ORDER — METOCLOPRAMIDE HCL 10 MG PO TABS: 10.0000 mg | ORAL_TABLET | Freq: Three times a day (TID) | ORAL | 1 refills | Status: DC | PRN

## 2018-09-25 NOTE — Progress Notes (Signed)
   PRENATAL VISIT NOTE  Subjective:  Heather Galloway is a 32 y.o. G2P1001 at 98w2dbeing seen today for ongoing prenatal care.  She is currently monitored for the following issues for this low-risk pregnancy and has Supervision of other normal pregnancy, antepartum; Language barrier; Hypothyroidism affecting pregnancy in second trimester; Leg cramps in pregnancy; Nausea/vomiting in pregnancy; and Nausea and vomiting during pregnancy prior to [redacted] weeks gestation on their problem list.  Patient reports no complaints.  Contractions: Not present. Vag. Bleeding: None.  Movement: Present. Denies leaking of fluid.   The following portions of the patient's history were reviewed and updated as appropriate: allergies, current medications, past family history, past medical history, past social history, past surgical history and problem list. Problem list updated.  Objective:   Vitals:   09/25/18 1011  BP: 102/63  Pulse: 75  Weight: 163 lb (73.9 kg)    Fetal Status: Fetal Heart Rate (bpm): 132 Fundal Height: 30 cm Movement: Present     General:  Alert, oriented and cooperative. Patient is in no acute distress.  Skin: Skin is warm and dry. No rash noted.   Cardiovascular: Normal heart rate noted  Respiratory: Normal respiratory effort, no problems with respiration noted  Abdomen: Soft, gravid, appropriate for gestational age.  Pain/Pressure: Absent     Pelvic: Cervical exam deferred        Extremities: Normal range of motion.  Edema: None  Mental Status: Normal mood and affect. Normal behavior. Normal judgment and thought content.   Assessment and Plan:  Pregnancy: G2P1001 at 351w2d1. Supervision of other normal pregnancy, antepartum - [place] OB FOLLOW UP; Future  2. Hypothyroidism affecting pregnancy in second trimester  Synthroid increased on 9/27: plan for repeat TSH around 10/27 US[place] growth scheduled today per MFM recommendations   3. Language barrier  Interpretor at bedside   4.  Nausea and vomiting during pregnancy prior to [redacted] weeks gestation - metoCLOPramide (REGLAN) 10 MG tablet; Take 1 tablet (10 mg total) by mouth every 8 (eight) hours as needed for nausea.  5. Nausea/vomiting in pregnancy  There are no diagnoses linked to this encounter. Preterm labor symptoms and general obstetric precautions including but not limited to vaginal bleeding, contractions, leaking of fluid and fetal movement were reviewed in detail with the patient. Please refer to After Visit Summary for other counseling recommendations.  Return in about 2 weeks (around 10/09/2018).  Future Appointments  Date Time Provider De[place]11/05/2018  8:55 AM BuEphraim HamburgereRona RavensNP [facility]A [facility]OC  10/17/2018 11:00 AM WHSt. Clair[place] [facility]-US    JeNoni SaupeNP

## 2018-09-25 NOTE — Patient Instructions (Signed)

## 2018-10-11 ENCOUNTER — Inpatient Hospital Stay (HOSPITAL_COMMUNITY)
Admission: AD | Admit: 2018-10-11 | Discharge: 2018-10-11 | Disposition: A | Discharge status: Home / Self Care | Payer: Medicaid Other | Source: Ambulatory Visit | Attending: Obstetrics & Gynecology | Admitting: Obstetrics & Gynecology

## 2018-10-11 ENCOUNTER — Encounter (HOSPITAL_COMMUNITY): Payer: Self-pay | Admitting: *Deleted

## 2018-10-11 DIAGNOSIS — J029 Acute pharyngitis, unspecified: Secondary | ICD-10-CM | POA: Insufficient documentation

## 2018-10-11 DIAGNOSIS — Z3A32 32 weeks gestation of pregnancy: Secondary | ICD-10-CM | POA: Insufficient documentation

## 2018-10-11 DIAGNOSIS — O99513 Diseases of the respiratory system complicating pregnancy, third trimester: Secondary | ICD-10-CM | POA: Insufficient documentation

## 2018-10-11 DIAGNOSIS — B9789 Other viral agents as the cause of diseases classified elsewhere: Secondary | ICD-10-CM

## 2018-10-11 DIAGNOSIS — J069 Acute upper respiratory infection, unspecified: Secondary | ICD-10-CM

## 2018-10-11 DIAGNOSIS — O99013 Anemia complicating pregnancy, third trimester: Secondary | ICD-10-CM | POA: Diagnosis not present

## 2018-10-11 LAB — URINALYSIS, ROUTINE W REFLEX MICROSCOPIC
BILIRUBIN URINE: NEGATIVE
Glucose, UA: NEGATIVE mg/dL
Hgb urine dipstick: NEGATIVE
KETONES UR: NEGATIVE mg/dL
LEUKOCYTES UA: NEGATIVE
NITRITE: NEGATIVE
PH: 6 (ref 5.0–8.0)
Protein, ur: NEGATIVE mg/dL
SPECIFIC GRAVITY, URINE: 1.015 (ref 1.005–1.030)

## 2018-10-11 LAB — GROUP A STREP BY PCR: GROUP A STREP BY PCR: NOT DETECTED

## 2018-10-11 MED ORDER — FERROUS SULFATE 325 (65 FE) MG PO TABS: 325.0000 mg | ORAL_TABLET | Freq: Every day | ORAL | 0 refills | Status: DC

## 2018-10-11 MED ORDER — GUAIFENESIN-DM 100-10 MG/5ML PO SYRP: 5.0000 mL | ORAL_SOLUTION | ORAL | 0 refills | Status: DC | PRN

## 2018-10-11 NOTE — MAU Note (Signed)
Pt c/o severe sore throat and headache and ears itchy since Sunday. Stated she thinks she had a fever last night but did not measure it. Pt has felt fatigued for 2 weeks with SOB.

## 2018-10-11 NOTE — [facility] Provider Note (Addendum)
Chief Complaint: Sore Throat and Headache   First Provider Initiated Contact with Patient 10/11/18 1206     SUBJECTIVE HPI: Heather Galloway is a 32 y.o. G2P1001  who presents to [facility] reporting sore throat, HA, bilateral ear pruritus, and fatigue. Her symptoms started on Saturday, 10/26 and progressively worsened. This morning she woke up with excessive flim, that she started to feel nausea, and vomited x 1. She has felt lethargic and had a decreased appetite. She has had chills, facial pressure and tenderness, and a cough as well. She has had normal urination. She denies fever, hematuria, or abdominal pain. Her son was sick with similar symptoms last week. She has not taken anything for her cough or congestion. She is [redacted]w[redacted]d She continues to have fetal movement. She had not had contractions, vaginal bleeding, or leakage of fluid.   Past Medical History:  Diagnosis Date  . Hyperthyroidism    OB History  Gravida Para Term Preterm AB Living  '2 1 1     1  '$ SAB TAB Ectopic Multiple Live Births          1    # Outcome Date GA Lbr Len/2nd Weight Sex Delivery Anes PTL Lv  2 Current           1 Term 02/2013 [id number] g M Vag-Spont  N LIV   Past Surgical History:  Procedure Laterality Date  . NO PAST SURGERIES     Social History   Socioeconomic History  . Marital status: Married    Spouse name: Not on file  . Number of children: Not on file  . Years of education: Not on file  . Highest education level: Not on file  Occupational History  . Not on file  Social Needs  . Financial resource strain: Not on file  . Food insecurity:    Worry: Not on file    Inability: Not on file  . Transportation needs:    Medical: Not on file    Non-medical: Not on file  Tobacco Use  . Smoking status: Never Smoker  . Smokeless tobacco: Never Used  Substance and Sexual Activity  . Alcohol use: Never    Frequency: Never  . Drug use: Never  . Sexual activity: Yes    Birth  control/protection: None  Lifestyle  . Physical activity:    Days per week: Not on file    Minutes per session: Not on file  . Stress: Not on file  Relationships  . Social connections:    Talks on phone: Not on file    Gets together: Not on file    Attends religious service: Not on file    Active member of club or organization: Not on file    Attends meetings of clubs or organizations: Not on file    Relationship status: Not on file  . Intimate partner violence:    Fear of current or ex partner: Not on file    Emotionally abused: Not on file    Physically abused: Not on file    Forced sexual activity: Not on file  Other Topics Concern  . Not on file  Social History Narrative  . Not on file   No current facility-administered medications on file prior to encounter.    Current Outpatient Medications on File Prior to Encounter  Medication Sig Dispense Refill  . levothyroxine (SYNTHROID) 137 MCG tablet Take 1 tablet (137 mcg total) by mouth daily before breakfast. 30 tablet 1  .  Magnesium 200 MG TABS Take 2 tablets (400 mg total) by mouth at bedtime as needed. (Patient taking differently: Take 200 mg by mouth at bedtime. ) 30 each 1  . metoCLOPramide (REGLAN) 10 MG tablet Take 1 tablet (10 mg total) by mouth every 8 (eight) hours as needed for nausea. 30 tablet 1  . Prenatal Vit-Fe Fumarate-FA (PREPLUS) 27-1 MG TABS Take 1 tablet by mouth daily. 30 tablet 13  . ondansetron (ZOFRAN ODT) 8 MG disintegrating tablet Take 1 tablet (8 mg total) by mouth every 8 (eight) hours as needed for nausea or vomiting. (Patient not taking: Reported on 10/11/2018) 30 tablet 1  . ondansetron (ZOFRAN) 4 MG tablet Take 1 tablet (4 mg total) by mouth every 8 (eight) hours as needed for nausea or vomiting. (Patient not taking: Reported on 06/08/2018) 20 tablet 0   No Known Allergies  I have reviewed the past Medical Hx, Surgical Hx, Social Hx, Allergies and Medications.   Review of Systems   Constitutional: Positive for chills. Negative for diaphoresis and fever.  HENT: Positive for congestion, ear pain (bilateral pruritus), sinus pain and sore throat. Negative for ear discharge.   Eyes: Negative for redness.  Respiratory: Positive for cough (denies deep chest cough). Negative for shortness of breath.   Cardiovascular: Negative for chest pain and palpitations.  Gastrointestinal: Positive for nausea and vomiting (x1 this morning). Negative for abdominal pain, blood in stool and diarrhea.  Genitourinary: Negative for dysuria and hematuria.  Neurological: Positive for headaches. Negative for dizziness.    OBJECTIVE Patient Vitals for the past 24 hrs:  BP Temp Pulse Resp SpO2 Height Weight  10/11/18 1433 - - - - 98 % - -  10/11/18 1429 (!) 108/52 - 93 - - - -  10/11/18 1250 116/67 - 98 - - - -  10/11/18 1118 (!) 120/93 98.2 F (36.8 C) (!) 110 18 - 5' 1.5" (1.562 m) 76.2 kg   Constitutional: Well-developed female in some distress. She appears to be very uncomfortable and is hoarse when talking. HEENT: Maxillary sinus TTP, Left ear cerumen impaction, Right ear not erythematous or bulging, fluid behind TM, Throat positive for cobblestoning, without erythema or swelling; No lymphadenopathy Cardiovascular: normal rate Respiratory: increased effort; normal rate, clear to auscultation bilaterally GI: Abd soft, non-tender, gravid appropriate for gestational age. Pos BS x 4 MS: Extremities nontender, no edema Neurologic: Alert and oriented x 4.  GU: Neg CVAT.  LAB RESULTS Results for orders placed or performed during the hospital encounter of 10/11/18 (from the past 24 hour(s))  Urinalysis, Routine w reflex microscopic     Status: Abnormal   Collection Time: 10/11/18 11:28 AM  Result Value Ref Range   Color, Urine YELLOW YELLOW   APPearance HAZY (A) CLEAR   Specific Gravity, Urine 1.015 1.005 - 1.030   pH 6.0 5.0 - 8.0   Glucose, UA NEGATIVE NEGATIVE mg/dL   Hgb urine  dipstick NEGATIVE NEGATIVE   Bilirubin Urine NEGATIVE NEGATIVE   Ketones, ur NEGATIVE NEGATIVE mg/dL   Protein, ur NEGATIVE NEGATIVE mg/dL   Nitrite NEGATIVE NEGATIVE   Leukocytes, UA NEGATIVE NEGATIVE  Group A Strep by PCR     Status: None   Collection Time: 10/11/18 12:48 PM  Result Value Ref Range   Group A Strep by PCR NOT DETECTED NOT DETECTED    IMAGING No results found.  MDM -Group A strep swab collected and negative. -Urinalysis completed. -Pulse ox was 99% on monitor.  ASSESSMENT 1. Viral URI with  cough    PLAN 1. Viral URI -Gave patient a list of OTC medications she can take during pregnancy for her cough, congestion, and pain.  2. Symptomatic Anemia -Patient given an iron supplement for decreased energy.  Discharge home in stable condition. Patient told to return if her symptoms worsen or if she starts to have a high fever.  Follow-up [place] [facility] up on 10/17/2018.   Specialty:  Obstetrics and Gynecology Contact information: [place] [phone]         Allergies as of 10/11/2018   No Known Allergies     Medication List    STOP taking these medications   ondansetron 4 MG tablet Commonly known as:  ZOFRAN   ondansetron 8 MG disintegrating tablet Commonly known as:  ZOFRAN-ODT     TAKE these medications   guaiFENesin-dextromethorphan 100-10 MG/5ML syrup Commonly known as:  ROBITUSSIN DM Take 5 mLs by mouth every 4 (four) hours as needed for cough.   levothyroxine 137 MCG tablet Commonly known as:  SYNTHROID, LEVOTHROID Take 1 tablet (137 mcg total) by mouth daily before breakfast.   Magnesium 200 MG Tabs Take 2 tablets (400 mg total) by mouth at bedtime as needed. What changed:    how much to take  when to take this   metoCLOPramide 10 MG tablet Commonly known as:  REGLAN Take 1 tablet (10 mg total) by mouth every 8 (eight) hours as needed for  nausea.   PREPLUS 27-1 MG Tabs Take 1 tablet by mouth daily.        Charyl Dancer, Student-PA 10/11/2018  2:55 PM   Evaluation and management procedures were performed by PA-S under my supervision/collaboration. Chart reviewed, patient examined by me and I agree with management and plan.

## 2018-10-11 NOTE — Discharge Instructions (Signed)
Iron-Rich Diet Iron is a mineral that helps your body to produce hemoglobin. Hemoglobin is a protein in your red blood cells that carries oxygen to your body's tissues. Eating too little iron may cause you to feel weak and tired, and it can increase your risk for infection. Eating enough iron is necessary for your body's metabolism, muscle function, and nervous system. Iron is naturally found in many foods. It can also be added to foods or fortified in foods. There are two types of dietary iron:  Heme iron. Heme iron is absorbed by the body more easily than nonheme iron. Heme iron is found in meat, poultry, and fish.  Nonheme iron. Nonheme iron is found in dietary supplements, iron-fortified grains, beans, and vegetables.  You may need to follow an iron-rich diet if:  You have been diagnosed with iron deficiency or iron-deficiency anemia.  You have a condition that prevents you from absorbing dietary iron, such as: ? Infection in your intestines. ? Celiac disease. This involves long-lasting (chronic) inflammation of your intestines.  You do not eat enough iron.  You eat a diet that is high in foods that impair iron absorption.  You have lost a lot of blood.  You have heavy bleeding during your menstrual cycle.  You are pregnant.  What is my plan? Your health care provider may help you to determine how much iron you need per day based on your condition. Generally, when a person consumes sufficient amounts of iron in the diet, the following iron needs are met:  Men. ? 66-61 years old: 11 mg per day. ? 16-18 years old: 8 mg per day.  Women. ? 50-5 years old: 15 mg per day. ? 47-2 years old: 18 mg per day. ? Over 43 years old: 8 mg per day. ? Pregnant women: 27 mg per day. ? Breastfeeding women: 9 mg per day.  What do I need to know about an iron-rich diet?  Eat fresh fruits and vegetables that are high in vitamin C along with foods that are high in iron. This will help  increase the amount of iron that your body absorbs from food, especially with foods containing nonheme iron. Foods that are high in vitamin C include oranges, peppers, tomatoes, and mango.  Take iron supplements only as directed by your health care provider. Overdose of iron can be life-threatening. If you were prescribed iron supplements, take them with orange juice or a vitamin C supplement.  Cook foods in pots and pans that are made from iron.  Eat nonheme iron-containing foods alongside foods that are high in heme iron. This helps to improve your iron absorption.  Certain foods and drinks contain compounds that impair iron absorption. Avoid eating these foods in the same meal as iron-rich foods or with iron supplements. These include: ? Coffee, black tea, and red wine. ? Milk, dairy products, and foods that are high in calcium. ? Beans, soybeans, and peas. ? Whole grains.  When eating foods that contain both nonheme iron and compounds that impair iron absorption, follow these tips to absorb iron better. ? Soak beans overnight before cooking. ? Soak whole grains overnight and drain them before using. ? Ferment flours before baking, such as using yeast in bread dough. What foods can I eat? Grains Iron-fortified breakfast cereal. Iron-fortified whole-wheat bread. Enriched rice. Sprouted grains. Vegetables Spinach. Potatoes with skin. Green peas. Broccoli. Red and green bell peppers. Fermented vegetables. Fruits Prunes. Raisins. Oranges. Strawberries. Mango. Grapefruit. Meats and Other Protein Sources  Beef liver. Oysters. Beef. Shrimp. [place]. Chicken. [place]. Sardines. Chickpeas. Nuts. Tofu. Beverages Tomato juice. Fresh orange juice. Prune juice. Hibiscus tea. Fortified instant breakfast shakes. Condiments Tahini. Fermented soy sauce. Sweets and Desserts Black-strap molasses. Other Wheat germ. The items listed above may not be a complete list of recommended foods or beverages.  Contact your dietitian for more options. What foods are not recommended? Grains Whole grains. Bran cereal. Bran flour. Oats. Vegetables Artichokes. Brussels sprouts. Kale. Fruits Blueberries. Raspberries. Strawberries. Figs. Meats and Other Protein Sources Soybeans. Products made from soy protein. Dairy Milk. Cream. Cheese. Yogurt. Cottage cheese. Beverages Coffee. Black tea. Red wine. Sweets and Desserts Cocoa. Chocolate. Ice cream. Other Basil. Oregano. Parsley. The items listed above may not be a complete list of foods and beverages to avoid. Contact your dietitian for more information. This information is not intended to replace advice given to you by your health care provider. Make sure you discuss any questions you have with your health care provider. Document Released: 07/12/2005 Document Revised: 06/17/2016 Document Reviewed: 06/25/2014 Elsevier Interactive Patient Education  2018 [place]. Cough, Adult Coughing is a reflex that clears your throat and your airways. Coughing helps to heal and protect your lungs. It is normal to cough occasionally, but a cough that happens with other symptoms or lasts a long time may be a sign of a condition that needs treatment. A cough may last only 2-3 weeks (acute), or it may last longer than 8 weeks (chronic). What are the causes? Coughing is commonly caused by:  Breathing in substances that irritate your lungs.  A viral or bacterial respiratory infection.  Allergies.  Asthma.  Postnasal drip.  Smoking.  Acid backing up from the stomach into the esophagus (gastroesophageal reflux).  Certain medicines.  Chronic lung problems, including COPD (or rarely, lung cancer).  Other medical conditions such as heart failure.  Follow these instructions at home: Pay attention to any changes in your symptoms. Take these actions to help with your discomfort:  Take medicines only as told by your health care provider. ? If you were  prescribed an antibiotic medicine, take it as told by your health care provider. Do not stop taking the antibiotic even if you start to feel better. ? Talk with your health care provider before you take a cough suppressant medicine.  Drink enough fluid to keep your urine clear or pale yellow.  If the air is dry, use a cold steam vaporizer or humidifier in your bedroom or your home to help loosen secretions.  Avoid anything that causes you to cough at work or at home.  If your cough is worse at night, try sleeping in a semi-upright position.  Avoid cigarette smoke. If you smoke, quit smoking. If you need help quitting, ask your health care provider.  Avoid caffeine.  Avoid alcohol.  Rest as needed.  Contact a health care provider if:  You have new symptoms.  You cough up pus.  Your cough does not get better after 2-3 weeks, or your cough gets worse.  You cannot control your cough with suppressant medicines and you are losing sleep.  You develop pain that is getting worse or pain that is not controlled with pain medicines.  You have a fever.  You have unexplained weight loss.  You have night sweats. Get help right away if:  You cough up blood.  You have difficulty breathing.  Your heartbeat is very fast. This information is not intended to replace advice given to you  by your health care provider. Make sure you discuss any questions you have with your health care provider. Document Released: 05/27/2011 Document Revised: 05/05/2016 Document Reviewed: 02/04/2015 Elsevier Interactive Patient Education  2018 [place].  Upper Respiratory Infection, Adult Most upper respiratory infections (URIs) are a viral infection of the air passages leading to the lungs. A URI affects the nose, throat, and upper air passages. The most common type of URI is nasopharyngitis and is typically referred to as "the common cold." URIs run their course and usually go away on their own. Most of  the time, a URI does not require medical attention, but sometimes a bacterial infection in the upper airways can follow a viral infection. This is called a secondary infection. Sinus and middle ear infections are common types of secondary upper respiratory infections. Bacterial pneumonia can also complicate a URI. A URI can worsen asthma and chronic obstructive pulmonary disease (COPD). Sometimes, these complications can require emergency medical care and may be life threatening. What are the causes? Almost all URIs are caused by viruses. A virus is a type of germ and can spread from one person to another. What increases the risk? You may be at risk for a URI if:  You smoke.  You have chronic heart or lung disease.  You have a weakened defense (immune) system.  You are very young or very old.  You have nasal allergies or asthma.  You work in crowded or poorly ventilated areas.  You work in health care facilities or schools.  What are the signs or symptoms? Symptoms typically develop 2-3 days after you come in contact with a cold virus. Most viral URIs last 7-10 days. However, viral URIs from the influenza virus (flu virus) can last 14-18 days and are typically more severe. Symptoms may include:  Runny or stuffy (congested) nose.  Sneezing.  Cough.  Sore throat.  Headache.  Fatigue.  Fever.  Loss of appetite.  Pain in your forehead, behind your eyes, and over your cheekbones (sinus pain).  Muscle aches.  How is this diagnosed? Your health care provider may diagnose a URI by:  Physical exam.  Tests to check that your symptoms are not due to another condition such as: ? Strep throat. ? Sinusitis. ? Pneumonia. ? Asthma.  How is this treated? A URI goes away on its own with time. It cannot be cured with medicines, but medicines may be prescribed or recommended to relieve symptoms. Medicines may help:  Reduce your fever.  Reduce your cough.  Relieve nasal  congestion.  Follow these instructions at home:  Take medicines only as directed by your health care provider.  Gargle warm saltwater or take cough drops to comfort your throat as directed by your health care provider.  Use a warm mist humidifier or inhale steam from a shower to increase air moisture. This may make it easier to breathe.  Drink enough fluid to keep your urine clear or pale yellow.  Eat soups and other clear broths and maintain good nutrition.  Rest as needed.  Return to work when your temperature has returned to normal or as your health care provider advises. You may need to stay home longer to avoid infecting others. You can also use a face mask and careful hand washing to prevent spread of the virus.  Increase the usage of your inhaler if you have asthma.  Do not use any tobacco products, including cigarettes, chewing tobacco, or electronic cigarettes. If you need help quitting, ask  your health care provider. How is this prevented? The best way to protect yourself from getting a cold is to practice good hygiene.  Avoid oral or hand contact with people with cold symptoms.  Wash your hands often if contact occurs.  There is no clear evidence that vitamin C, vitamin E, echinacea, or exercise reduces the chance of developing a cold. However, it is always recommended to get plenty of rest, exercise, and practice good nutrition. Contact a health care provider if:  You are getting worse rather than better.  Your symptoms are not controlled by medicine.  You have chills.  You have worsening shortness of breath.  You have brown or red mucus.  You have yellow or brown nasal discharge.  You have pain in your face, especially when you bend forward.  You have a fever.  You have swollen neck glands.  You have pain while swallowing.  You have white areas in the back of your throat. Get help right away if:  You have severe or persistent: ? Headache. ? Ear  pain. ? Sinus pain. ? Chest pain.  You have chronic lung disease and any of the following: ? Wheezing. ? Prolonged cough. ? Coughing up blood. ? A change in your usual mucus.  You have a stiff neck.  You have changes in your: ? Vision. ? Hearing. ? Thinking. ? Mood. This information is not intended to replace advice given to you by your health care provider. Make sure you discuss any questions you have with your health care provider. Document Released: 05/24/2001 Document Revised: 07/31/2016 Document Reviewed: 03/05/2014 Elsevier Interactive Patient Education  [employer].

## 2018-10-11 NOTE — MAU Provider Note (Signed)
History     CSN: [id number]  Arrival date and time: 10/11/18 1102   First Provider Initiated Contact with Patient 10/11/18 1206    [place] interpreter via phone  Chief Complaint  Patient presents with  . Sore Throat  . Headache   Heather Galloway is a 32 y.o. G2P1001 at 53w4dpresent with multiple concerns: sore throat, dry cough, itchy ears, H/A, fatigue and SOB. The chief concern is sore throat.No pain or SOB at present. Her symptoms started on Saturday, 10/26 and progressively worsened. This morning she woke up with excessive phlegm associated with nausea and vomited x1. Cough is nonproductive. started to feel nausea, and vomited x 1. She has felt lethargic and had a decreased appetite. She has had chills, facial pressure and tenderness, and a cough as well. She has had normal urination. She denies fever, hematuria, or abdominal pain. Her son was sick with similar symptoms last week. She has not taken anything for her cough or congestion   Good FM, no vaginal bleeding or leaking of fluid.  No contractions or abdominal pain.  Pregnancy complicated by hypothyroidism on Synthroid.  OB History  Gravida Para Term Preterm AB Living  '2 1 1     1  '$ SAB TAB Ectopic Multiple Live Births          1    # Outcome Date GA Lbr Len/2nd Weight Sex Delivery Anes PTL Lv  2 Current           1 Term 02/2013 [id number] g M Vag-Spont  N LIV     Past Medical History:  Diagnosis Date  . Hyperthyroidism     Past Surgical History:  Procedure Laterality Date  . NO PAST SURGERIES      History reviewed. No pertinent family history.  Social History   Tobacco Use  . Smoking status: Never Smoker  . Smokeless tobacco: Never Used  Substance Use Topics  . Alcohol use: Never    Frequency: Never  . Drug use: Never    Allergies: No Known Allergies  Medications Prior to Admission  Medication Sig Dispense Refill Last Dose  . levothyroxine (SYNTHROID) 137 MCG tablet Take 1 tablet (137 mcg total) by  mouth daily before breakfast. 30 tablet 1 10/10/2018 at Unknown time  . Magnesium 200 MG TABS Take 2 tablets (400 mg total) by mouth at bedtime as needed. (Patient taking differently: Take 200 mg by mouth at bedtime. ) 30 each 1 10/10/2018 at Unknown time  . metoCLOPramide (REGLAN) 10 MG tablet Take 1 tablet (10 mg total) by mouth every 8 (eight) hours as needed for nausea. 30 tablet 1 Past Week at Unknown time  . Prenatal Vit-Fe Fumarate-FA (PREPLUS) 27-1 MG TABS Take 1 tablet by mouth daily. 30 tablet 13 10/10/2018 at Unknown time  . ondansetron (ZOFRAN ODT) 8 MG disintegrating tablet Take 1 tablet (8 mg total) by mouth every 8 (eight) hours as needed for nausea or vomiting. (Patient not taking: Reported on 10/11/2018) 30 tablet 1 Not Taking at Unknown time  . ondansetron (ZOFRAN) 4 MG tablet Take 1 tablet (4 mg total) by mouth every 8 (eight) hours as needed for nausea or vomiting. (Patient not taking: Reported on 06/08/2018) 20 tablet 0 Not Taking    Review of Systems  Constitutional: Positive for appetite change and fatigue. Negative for diaphoresis and fever.  HENT: Positive for congestion and sore throat. Negative for ear discharge, ear pain and sinus pressure.  Ear canals itch  Respiratory: Positive for cough and shortness of breath. Negative for chest tightness, wheezing and stridor.        Lungs clear throughout with good air movemnet  Gastrointestinal: Negative for abdominal pain.  Genitourinary: Negative for dysuria, flank pain, frequency, hematuria, urgency, vaginal bleeding and vaginal discharge.  Musculoskeletal: Negative for back pain.  Neurological: Positive for weakness and headaches. Negative for syncope.       Denies orthostatic symptoms  Psychiatric/Behavioral: The patient is nervous/anxious.    Physical Exam   Blood pressure (!) 120/93, pulse (!) 110, temperature 98.2 F (36.8 C), resp. rate 18, height 5' 1.5" (1.562 m), weight 76.2 kg, last menstrual period  03/08/2018.  Patient Vitals for the past 24 hrs:  BP Temp Pulse Resp SpO2 Height Weight  10/11/18 1433 - - - - 98 % - -  10/11/18 1429 (!) 108/52 - 93 - - - -  10/11/18 1250 116/67 - 98 - - - -  10/11/18 1118 (!) 120/93 98.2 F (36.8 C) (!) 110 18 - 5' 1.5" (1.562 m) 76.2 kg   Physical Exam  Nursing note and vitals reviewed. Constitutional: She is oriented to person, place, and time. She appears well-developed and well-nourished. She appears distressed.  HENT:  Head: Normocephalic.  TTP over maxillary sinuses  Eyes: No scleral icterus.  Neck: No thyromegaly present.  Cardiovascular: Normal rate and regular rhythm.  Respiratory: Effort normal and breath sounds normal. She has no wheezes. She exhibits no tenderness.  GI: Soft. There is no tenderness. There is no guarding.  DT FHR 140 S=D  Musculoskeletal: Normal range of motion.  Neurological: She is alert and oriented to person, place, and time.  Skin: Skin is warm and dry.  Psychiatric: She has a normal mood and affect. Her behavior is normal.    MAU Course  Procedures  Results for orders placed or performed during the hospital encounter of 10/11/18 (from the past 24 hour(s))  Urinalysis, Routine w reflex microscopic     Status: Abnormal   Collection Time: 10/11/18 11:28 AM  Result Value Ref Range   Color, Urine YELLOW YELLOW   APPearance HAZY (A) CLEAR   Specific Gravity, Urine 1.015 1.005 - 1.030   pH 6.0 5.0 - 8.0   Glucose, UA NEGATIVE NEGATIVE mg/dL   Hgb urine dipstick NEGATIVE NEGATIVE   Bilirubin Urine NEGATIVE NEGATIVE   Ketones, ur NEGATIVE NEGATIVE mg/dL   Protein, ur NEGATIVE NEGATIVE mg/dL   Nitrite NEGATIVE NEGATIVE   Leukocytes, UA NEGATIVE NEGATIVE  Group A Strep by PCR     Status: None   Collection Time: 10/11/18 12:48 PM  Result Value Ref Range   Group A Strep by PCR NOT DETECTED NOT DETECTED   Hgb 10.4 on 9/25/20191  Discussed late gestational discomforts at length. Add iron rich foods and  FeSO4 1/day. Isolated borderline BP> rechecks normal  Assessment and Plan  G2P1001 at 32w4d1. Viral URI with cough    Allergies as of 10/11/2018   No Known Allergies     Medication List    STOP taking these medications   ondansetron 4 MG tablet Commonly known as:  ZOFRAN   ondansetron 8 MG disintegrating tablet Commonly known as:  ZOFRAN-ODT     TAKE these medications   ferrous sulfate 325 (65 FE) MG tablet Take 1 tablet (325 mg total) by mouth daily.   guaiFENesin-dextromethorphan 100-10 MG/5ML syrup Commonly known as:  ROBITUSSIN DM Take 5 mLs by mouth every 4 (four)  hours as needed for cough.   levothyroxine 137 MCG tablet Commonly known as:  SYNTHROID, LEVOTHROID Take 1 tablet (137 mcg total) by mouth daily before breakfast.   Magnesium 200 MG Tabs Take 2 tablets (400 mg total) by mouth at bedtime as needed. What changed:    how much to take  when to take this   metoCLOPramide 10 MG tablet Commonly known as:  REGLAN Take 1 tablet (10 mg total) by mouth every 8 (eight) hours as needed for nausea.   PREPLUS 27-1 MG Tabs Take 1 tablet by mouth daily.     . [facility] Follow up on 10/17/2018.   Specialty:  Obstetrics and Gynecology Contact information: [place] [phone]         Penelope 10/11/2018, 12:31 PM

## 2018-10-17 ENCOUNTER — Ambulatory Visit (HOSPITAL_COMMUNITY)
Admission: RE | Admit: 2018-10-17 | Discharge: 2018-10-17 | Disposition: A | Discharge status: Home / Self Care | Payer: Medicaid Other | Source: Ambulatory Visit | Attending: Nurse Practitioner | Admitting: Nurse Practitioner

## 2018-10-17 ENCOUNTER — Ambulatory Visit (INDEPENDENT_AMBULATORY_CARE_PROVIDER_SITE_OTHER): Payer: Medicaid Other | Admitting: Nurse Practitioner

## 2018-10-17 ENCOUNTER — Encounter (HOSPITAL_COMMUNITY): Payer: Self-pay

## 2018-10-17 VITALS — BP 101/65 | HR 78 | Wt 157.8 lb

## 2018-10-17 DIAGNOSIS — O99283 Endocrine, nutritional and metabolic diseases complicating pregnancy, third trimester: Secondary | ICD-10-CM

## 2018-10-17 DIAGNOSIS — Z362 Encounter for other antenatal screening follow-up: Secondary | ICD-10-CM | POA: Diagnosis not present

## 2018-10-17 DIAGNOSIS — R197 Diarrhea, unspecified: Secondary | ICD-10-CM

## 2018-10-17 DIAGNOSIS — O99282 Endocrine, nutritional and metabolic diseases complicating pregnancy, second trimester: Secondary | ICD-10-CM

## 2018-10-17 DIAGNOSIS — Z3483 Encounter for supervision of other normal pregnancy, third trimester: Secondary | ICD-10-CM

## 2018-10-17 DIAGNOSIS — E039 Hypothyroidism, unspecified: Secondary | ICD-10-CM

## 2018-10-17 DIAGNOSIS — Z789 Other specified health status: Secondary | ICD-10-CM

## 2018-10-17 DIAGNOSIS — Z3A31 31 weeks gestation of pregnancy: Secondary | ICD-10-CM | POA: Diagnosis not present

## 2018-10-17 DIAGNOSIS — Z3480 Encounter for supervision of other normal pregnancy, unspecified trimester: Secondary | ICD-10-CM

## 2018-10-17 NOTE — Progress Notes (Signed)
Will need refill of synthroid soon, is going to get her last refill. States no appetite and also states was overweight before pregnancy and doesn't want to eat like old day. States sometimes goes to sleep with out eating. Encouraged her to eat 3 small meals and 3 snacks a day to gain appropriate weight for baby. Elevated phq9 . Offered to see [facility]- she states she can;'t today due to other appointment. Informed her she can make appt to see Asher Muir another day. States will see her next visit.

## 2018-10-17 NOTE — Patient Instructions (Addendum)
Diarrhea, Adult Diarrhea is when you have loose and water poop (stool) often. Diarrhea can make you feel weak and cause you to get dehydrated. Dehydration can make you tired and thirsty, make you have a dry mouth, and make it so you pee (urinate) less often. Diarrhea often lasts 2-3 days. However, it can last longer if it is a sign of something more serious. It is important to treat your diarrhea as told by your doctor. Follow these instructions at home: Eating and drinking  Follow these recommendations as told by your doctor:  Take an oral rehydration solution (ORS). This is a drink that is sold at pharmacies and stores.  Drink clear fluids, such as: ? Water. ? Ice chips. ? Diluted fruit juice. ? Low-calorie sports drinks.  Eat bland, easy-to-digest foods in small amounts as you are able. These foods include: ? Bananas. ? Applesauce. ? Rice. ? Low-fat (lean) meats. ? Toast. ? Crackers.  Avoid drinking fluids that have a lot of sugar or caffeine in them.  Avoid alcohol.  Avoid spicy or fatty foods.  General instructions   Drink enough fluid to keep your pee (urine) clear or pale yellow.  Wash your hands often. If you cannot use soap and water, use hand sanitizer.  Make sure that all people in your home wash their hands well and often.  Take over-the-counter and prescription medicines only as told by your doctor.  Rest at home while you get better.  Watch your condition for any changes.  Take a warm bath to help with any burning or pain from having diarrhea.  Keep all follow-up visits as told by your doctor. This is important. Contact a doctor if:  You have a fever.  Your diarrhea gets worse.  You have new symptoms.  You cannot keep fluids down.  You feel light-headed or dizzy.  You have a headache.  You have muscle cramps. Get help right away if:  You have chest pain.  You feel very weak or you pass out (faint).  You have bloody or black poop or  poop that look like tar.  You have very bad pain, cramping, or bloating in your belly (abdomen).  You have trouble breathing or you are breathing very quickly.  Your heart is beating very quickly.  Your skin feels cold and clammy.  You feel confused.  You have signs of dehydration, such as: ? Dark pee, hardly any pee, or no pee. ? Cracked lips. ? Dry mouth. ? Sunken eyes. ? Sleepiness. ? Weakness. This information is not intended to replace advice given to you by your health care provider. Make sure you discuss any questions you have with your health care provider. Document Released: 05/16/2008 Document Revised: 06/17/2016 Document Reviewed: 08/04/2015 Elsevier Interactive Patient Education  2018 [employer].    Contraception Choices Contraception, also called birth control, refers to methods or devices that prevent pregnancy. Hormonal methods Contraceptive implant A contraceptive implant is a thin, plastic tube that contains a hormone. It is inserted into the upper part of the arm. It can remain in place for up to 3 years. Progestin-only injections Progestin-only injections are injections of progestin, a synthetic form of the hormone progesterone. They are given every 3 months by a health care provider. Birth control pills Birth control pills are pills that contain hormones that prevent pregnancy. They must be taken once a day, preferably at the same time each day. Birth control patch The birth control patch contains hormones that prevent pregnancy. It  is placed on the skin and must be changed once a week for three weeks and removed on the fourth week. A prescription is needed to use this method of contraception. Vaginal ring A vaginal ring contains hormones that prevent pregnancy. It is placed in the vagina for three weeks and removed on the fourth week. After that, the process is repeated with a new ring. A prescription is needed to use this method of  contraception. Emergency contraceptive Emergency contraceptives prevent pregnancy after unprotected sex. They come in pill form and can be taken up to 5 days after sex. They work best the sooner they are taken after having sex. Most emergency contraceptives are available without a prescription. This method should not be used as your only form of birth control. Barrier methods Female condom A female condom is a thin sheath that is worn over the penis during sex. Condoms keep sperm from going inside a woman's body. They can be used with a spermicide to increase their effectiveness. They should be disposed after a single use. Female condom A female condom is a soft, loose-fitting sheath that is put into the vagina before sex. The condom keeps sperm from going inside a woman's body. They should be disposed after a single use. Diaphragm A diaphragm is a soft, dome-shaped barrier. It is inserted into the vagina before sex, along with a spermicide. The diaphragm blocks sperm from entering the uterus, and the spermicide kills sperm. A diaphragm should be left in the vagina for 6-8 hours after sex and removed within 24 hours. A diaphragm is prescribed and fitted by a health care provider. A diaphragm should be replaced every 1-2 years, after giving birth, after gaining more than 15 lb (6.8 kg), and after pelvic surgery. Cervical cap A cervical cap is a round, soft latex or plastic cup that fits over the cervix. It is inserted into the vagina before sex, along with spermicide. It blocks sperm from entering the uterus. The cap should be left in place for 6-8 hours after sex and removed within 48 hours. A cervical cap must be prescribed and fitted by a health care provider. It should be replaced every 2 years. Sponge A sponge is a soft, circular piece of polyurethane foam with spermicide on it. The sponge helps block sperm from entering the uterus, and the spermicide kills sperm. To use it, you make it wet and then  insert it into the vagina. It should be inserted before sex, left in for at least 6 hours after sex, and removed and thrown away within 30 hours. Spermicides Spermicides are chemicals that kill or block sperm from entering the cervix and uterus. They can come as a cream, jelly, suppository, foam, or tablet. A spermicide should be inserted into the vagina with an applicator at least 10-15 minutes before sex to allow time for it to work. The process must be repeated every time you have sex. Spermicides do not require a prescription. Intrauterine contraception Intrauterine device (IUD) An IUD is a T-shaped device that is put in a woman's uterus. There are two types:  Hormone IUD.This type contains progestin, a synthetic form of the hormone progesterone. This type can stay in place for 3-5 years.  Copper IUD.This type is wrapped in copper wire. It can stay in place for 10 years.  Permanent methods of contraception Female tubal ligation In this method, a woman's fallopian tubes are sealed, tied, or blocked during surgery to prevent eggs from traveling to the uterus. Hysteroscopic sterilization  In this method, a small, flexible insert is placed into each fallopian tube. The inserts cause scar tissue to form in the fallopian tubes and block them, so sperm cannot reach an egg. The procedure takes about 3 months to be effective. Another form of birth control must be used during those 3 months. Female sterilization This is a procedure to tie off the tubes that carry sperm (vasectomy). After the procedure, the man can still ejaculate fluid (semen). Natural planning methods Natural family planning In this method, a couple does not have sex on days when the woman could become pregnant. Calendar method This means keeping track of the length of each menstrual cycle, identifying the days when pregnancy can happen, and not having sex on those days. Ovulation method In this method, a couple avoids sex during  ovulation. Symptothermal method This method involves not having sex during ovulation. The woman typically checks for ovulation by watching changes in her temperature and in the consistency of cervical mucus. Post-ovulation method In this method, a couple waits to have sex until after ovulation. Summary  Contraception, also called birth control, means methods or devices that prevent pregnancy.  Hormonal methods of contraception include implants, injections, pills, patches, vaginal rings, and emergency contraceptives.  Barrier methods of contraception can include female condoms, female condoms, diaphragms, cervical caps, sponges, and spermicides.  There are two types of IUDs (intrauterine devices). An IUD can be put in a woman's uterus to prevent pregnancy for 3-5 years.  Permanent sterilization can be done through a procedure for males, females, or both.  Natural family planning methods involve not having sex on days when the woman could become pregnant. This information is not intended to replace advice given to you by your health care provider. Make sure you discuss any questions you have with your health care provider. Document Released: 11/28/2005 Document Revised: 12/31/2016 Document Reviewed: 12/31/2016 Elsevier Interactive Patient Education  2018 [employer].  Contraceptive Implant Information A contraceptive implant is a small, plastic rod that is inserted under the skin. The implant releases a hormone into the bloodstream that prevents pregnancy. Contraceptive implants can be effective for up to 3 years. They do not provide protection against STIs (sexually transmitted infections). How does the implant work? Contraceptive implants prevent pregnancy by releasing a small amount of progestin into the bloodstream. Progestin has similar effects to the hormone progesterone, which plays a role in menstrual periods and pregnancy. Progestin will:  Stop the ovaries from releasing  eggs.  Thicken cervical mucus to prevent sperm from entering the cervix.  Thin out the lining of the uterus to prevent a fertilized egg from attaching to the wall of the uterus.  What are the advantages of this form of birth control? The advantages of this form of birth control include the following:  It is very effective at preventing pregnancy.  It is effective for up to 3 years.  It can easily be removed.  It does not interfere with sex or daily activities.  It can be used when breastfeeding.  It can be used by women who cannot take estrogen.  The procedure to insert the device is quick.  Women can get pregnant shortly after removing the device.  What are the disadvantages of this form of birth control? The disadvantages of this form of birth control include the following:  It can cause side effects, including: ? Irregular menstrual periods or bleeding. ? Headache. ? Weight gain. ? Acne. ? Breast tenderness. ? Abdomen (abdominal) pain. ?  Mood changes, such as depression.  It does not protect against STIs.  You must make an office visit to have it inserted and removed by a trained clinician.  Inserting or removing the device can result in pain, scarring, and tissue or nerve damage (rare).  How is this implant inserted? The procedure to insert an implant only takes a few minutes. During the procedure:  Your upper arm will be numbed with a numbing medicine (local anesthetic).  The implant will be injected under the skin of your upper arm with a needle.  After the procedure:  You may experience minor bruising, swelling, or discomfort at the insertion site. This should only last for a couple of days.  You may need to use another, non-hormonal contraceptive such as a condom for 7 days after the procedure.  How is the implant removed? The implant should be removed after 3 years or as directed by your health care provider. The procedure to remove the implant only  takes a few minutes. During this procedure:  Your upper arm will be numbed with a local anesthetic.  A small incision will be made near the implant.  The implant will be removed with a small pair of forceps.  After the implant is removed:  The effect of the implant will wear off a few hours after removal. Most women will be able to get pregnant within 3 weeks of removal.  A new implant can be inserted as soon as the old one is removed, if desired.  You may experience minor bruising, swelling, or discomfort at the removal site. This should only last for a couple of days.  Is this implant right for me? Your health care provider can help you determine whether you are good candidate for a contraceptive implant. Make sure to discuss the possible side effects with your health care provider. You should not get the implant if you:  Are pregnant.  Are allergic to any part of the implant.  Have a history of: ? Breast cancer. ? Unusual bleeding from the vagina. ? Heart disease. ? Stroke. ? Liver disease or tumors. ? Migraines.  Summary  A contraceptive implant is a small, plastic rod that is inserted under the skin. The implant releases a hormone into the bloodstream that prevents pregnancy.  Contraceptive implants can be effective for up to 3 years.  The implant works by preventing ovaries from releasing eggs, thickening the cervical mucus, and thinning the uterine wall.  This form of birth control is very effective at preventing pregnancy and can be inserted and removed quickly. Women can get pregnant shortly after the device is removed.  This form of birth control can cause some side effects, including weight gain, breast tenderness, headaches, irregular periods or bleeding, acne, abdominal pain, and depression. It does not provide protection against STIs (sexually transmitted infections). This information is not intended to replace advice given to you by your health care provider.  Make sure you discuss any questions you have with your health care provider. Document Released: 11/17/2011 Document Revised: 11/12/2016 Document Reviewed: 11/12/2016 Elsevier Interactive Patient Education  2017 [employer].  Contraceptive Injection Information A contraceptive injection is a shot that prevents pregnancy. It is effective for 3 months. How does the injection work? For this injection, the medicine progestin is injected into your body. The medicine is injected under the skin or in the muscle, usually in the upper arm or the buttock. Progestin has similar effects to the hormone progesterone, which is involved  with the menstrual cycle and pregnancy. The progestin injection prevents pregnancy by:  Stopping the ovaries from releasing eggs.  Thickening cervical mucus to prevent sperm from entering the cervix.  Thinning the lining of the uterus to prevent a fertilized egg from attaching to the uterus.  What are the advantages of this form of birth control? The following are some advantages of this form of birth control:  It is highly effective at preventing pregnancy when used correctly.  It can slow down the flow of heavy menstrual periods.  It can control cramps and painful menstrual periods.  It may temporarily stop your menstrual periods (amenorrhea).  It lowers your risk for developing cancer of the uterus and pelvic inflammatory disease (PID).  What are the disadvantages of this form of birth control? The following are some disadvantages of this form of birth control:  It can be associated with side effects such as: ? Weight gain. ? Spotting or bleeding between periods. ? Breast tenderness. ? Headaches. ? Abdominal discomfort. ? Nervousness. ? Loss of bone density.  It does not protect against STIs (sexually transmitted infections).  You must visit your health care provider every 3 months (12 weeks) to receive the injection.  The injections may be  uncomfortable.  It can take you up to a year to become pregnant after you stop the injections.  Am I a good candidate for these injections? Your health care provider can help you determine whether you are a good candidate for contraceptive injections. Make sure to discuss the possible side effects with your health care provider. You should not use this contraceptive if you have a history of:  Breast cancer.  High blood pressure.  Heart attack or stroke.  Liver, heart, or kidney disease.  Liver cancer.  Diabetes.  Unexplained vaginal bleeding.  Rheumatoid arthritis.  Migraines.  Summary  A contraceptive injection is a shot that prevents pregnancy. It is effective for 3 months.  This injection prevents pregnancy by thickening the cervical mucus, thinning the uterine wall, and stopping the ovaries from releasing eggs.  This type of birth control is highly effective at preventing pregnancy. It can also slow down the flow of periods or stop them temporarily.  Side effects of this injection can include weight gain, headaches, bleeding between periods, nervousness, and loss of bone density.  Your health care provider can help you determine whether you are a good candidate for contraceptive injections. This information is not intended to replace advice given to you by your health care provider. Make sure you discuss any questions you have with your health care provider. Document Released: 11/17/2011 Document Revised: 10/17/2016 Document Reviewed: 10/17/2016 Elsevier Interactive Patient Education  2017 [employer]  Intrauterine Device Information An intrauterine device (IUD) is inserted into your uterus to prevent pregnancy. There are two types of IUDs available:  Copper IUD-This type of IUD is wrapped in copper wire and is placed inside the uterus. Copper makes the uterus and fallopian tubes produce a fluid that kills sperm. The copper IUD can stay in place for 10  years.  Hormone IUD-This type of IUD contains the hormone progestin (synthetic progesterone). The hormone thickens the cervical mucus and prevents sperm from entering the uterus. It also thins the uterine lining to prevent implantation of a fertilized egg. The hormone can weaken or kill the sperm that get into the uterus. One type of hormone IUD can stay in place for 5 years, and another type can stay in place for 3 years.  Your health care provider will make sure you are a good candidate for a contraceptive IUD. Discuss with your health care provider the possible side effects. Advantages of an intrauterine device  IUDs are highly effective, reversible, long acting, and low maintenance.  There are no estrogen-related side effects.  An IUD can be used when breastfeeding.  IUDs are not associated with weight gain.  The copper IUD works immediately after insertion.  The hormone IUD works right away if inserted within 7 days of your period starting. You will need to use a backup method of birth control for 7 days if the hormone IUD is inserted at any other time in your cycle.  The copper IUD does not interfere with your female hormones.  The hormone IUD can make heavy menstrual periods lighter and decrease cramping.  The hormone IUD can be used for 3 or 5 years.  The copper IUD can be used for 10 years. Disadvantages of an intrauterine device  The hormone IUD can be associated with irregular bleeding patterns.  The copper IUD can make your menstrual flow heavier and more painful.  You may experience cramping and vaginal bleeding after insertion. This information is not intended to replace advice given to you by your health care provider. Make sure you discuss any questions you have with your health care provider. Document Released: 11/01/2004 Document Revised: 05/05/2016 Document Reviewed: 05/19/2013 Elsevier Interactive Patient Education  2017 [employer].

## 2018-10-17 NOTE — Progress Notes (Signed)
    Subjective:  Heather Galloway is a 32 y.o. G2P1001 at 20w3dbeing seen today for ongoing prenatal care.  She is currently monitored for the following issues for this low-risk pregnancy and has Supervision of other normal pregnancy, antepartum; Language barrier; Hypothyroidism affecting pregnancy in second trimester; Leg cramps in pregnancy; Nausea/vomiting in pregnancy; and Nausea and vomiting during pregnancy prior to [redacted] weeks gestation on their problem list.  Patient reports feeling weak, having diarrhea, worried about weight loss.  Contractions: Not present. Vag. Bleeding: None, Bloody Show.  Movement: Present. Denies leaking of fluid.   The following portions of the patient's history were reviewed and updated as appropriate: allergies, current medications, past family history, past medical history, past social history, past surgical history and problem list. Problem list updated.  Objective:   Vitals:   10/17/18 0933  BP: 101/65  Pulse: 78  Weight: 157 lb 12.8 oz (71.6 kg)    Fetal Status: Fetal Heart Rate (bpm): 140 Fundal Height: 31 cm Movement: Present     General:  Alert, oriented and cooperative. Patient is in no acute distress.  Skin: Skin is warm and dry. No rash noted.   Cardiovascular: Normal heart rate noted  Respiratory: Normal respiratory effort, no problems with respiration noted  Abdomen: Soft, gravid, appropriate for gestational age. Pain/Pressure: Present     Pelvic:  Cervical exam deferred        Extremities: Normal range of motion.  Edema: None  Mental Status: Normal mood and affect. Normal behavior. Normal judgment and thought content.   Urinalysis:      Assessment and Plan:  Pregnancy: G2P1001 at 34w3d1. Supervision of other normal pregnancy, antepartum Still having leg cramps. Cold and sore throat has improved - was seen in [facility] earlier Does not feel like eating. Client talked extensively with nurse and then talked extensively again with provider. Is not  feeling well, has not fainted but feels weak.  Is likely not eating enough or drinking enough fluids. [facility].  Discussed depressive symptoms but cannot stay after her visit today.  2. Language barrier Video Interpreter present for all of visit  3. Hypothyroidism affecting pregnancy in second trimester  - TSH To get refill of meds from pharmacy. Then will need refill sent to pharmacy. To check TSH today as she had a medication change 4 weeks ago.  4. Diarrhea, unspecified type Advised not fried foods or fats.  Will bring back again in one week to check to see how she is doing.  Preterm labor symptoms and general obstetric precautions including but not limited to vaginal bleeding, contractions, leaking of fluid and fetal movement were reviewed in detail with the patient. Please refer to After Visit Summary for other counseling recommendations.  Return in about 1 week (around 10/24/2018) for obfu and needs to see Jamie/[facility].  TEEarlie ServerRN, MSN, NP-BC Nurse Practitioner, [facility]Co[facility] 10/17/2018 1:03 PM

## 2018-10-18 ENCOUNTER — Other Ambulatory Visit (HOSPITAL_COMMUNITY): Payer: Self-pay | Admitting: *Deleted

## 2018-10-18 DIAGNOSIS — E039 Hypothyroidism, unspecified: Secondary | ICD-10-CM

## 2018-10-18 DIAGNOSIS — O99283 Endocrine, nutritional and metabolic diseases complicating pregnancy, third trimester: Principal | ICD-10-CM

## 2018-10-18 LAB — TSH: TSH: 1.43 u[IU]/mL (ref 0.450–4.500)

## 2018-10-31 ENCOUNTER — Ambulatory Visit (INDEPENDENT_AMBULATORY_CARE_PROVIDER_SITE_OTHER): Payer: Medicaid Other | Admitting: Nurse Practitioner

## 2018-10-31 VITALS — BP 102/73 | HR 75 | Wt 162.8 lb

## 2018-10-31 DIAGNOSIS — Z3A35 35 weeks gestation of pregnancy: Secondary | ICD-10-CM

## 2018-10-31 DIAGNOSIS — R12 Heartburn: Secondary | ICD-10-CM

## 2018-10-31 DIAGNOSIS — Z3483 Encounter for supervision of other normal pregnancy, third trimester: Secondary | ICD-10-CM

## 2018-10-31 DIAGNOSIS — O26899 Other specified pregnancy related conditions, unspecified trimester: Secondary | ICD-10-CM

## 2018-10-31 DIAGNOSIS — O26893 Other specified pregnancy related conditions, third trimester: Secondary | ICD-10-CM

## 2018-10-31 DIAGNOSIS — Z3480 Encounter for supervision of other normal pregnancy, unspecified trimester: Secondary | ICD-10-CM

## 2018-10-31 MED ORDER — OMEPRAZOLE 40 MG PO CPDR: 40.0000 mg | DELAYED_RELEASE_CAPSULE | Freq: Every day | ORAL | 2 refills | Status: DC

## 2018-10-31 NOTE — Patient Instructions (Signed)
Tums - over the counter medication for heartburn

## 2018-10-31 NOTE — Progress Notes (Signed)
    Subjective:  Heather Galloway Galloway is a 32 y.o. G2P1001 at 63w3dbeing seen today for ongoing prenatal care.  She is currently monitored for the following issues for this low-risk pregnancy and has Supervision of other normal pregnancy, antepartum; Language barrier; Hypothyroidism affecting pregnancy in second trimester; Leg cramps in pregnancy; Nausea/vomiting in pregnancy; and Nausea and vomiting during pregnancy prior to [redacted] weeks gestation on their problem list. Video interpreter used for the entire visit.   Patient reports no complaints.  Contractions: Not present. Vag. Bleeding: None.  Movement: Present. Denies leaking of fluid. Heather Galloway Singhhas vomiting a couple of times a week - smelling fried foods will cause her to vomit.  Let cramps are still occasional - taking magnesium and eating bananas.  The following portions of the patient's history were reviewed and updated as appropriate: allergies, current medications, past family history, past medical history, past social history, past surgical history and problem list. Problem list updated.  Objective:   Vitals:   10/31/18 1040  BP: 102/73  Pulse: 75  Weight: 162 lb 12.8 oz (73.8 kg)    Fetal Status: Fetal Heart Rate (bpm): 146 Fundal Height: 33 cm Movement: Present     General:  Alert, oriented and cooperative. Patient is in no acute distress.  Skin: Skin is warm and dry. No rash noted.   Cardiovascular: Normal heart rate noted  Respiratory: Normal respiratory effort, no problems with respiration noted  Abdomen: Soft, gravid, appropriate for gestational age. Pain/Pressure: Present     Pelvic:  Cervical exam deferred        Extremities: Normal range of motion.  Edema: None  Mental Status: Normal mood and affect. Normal behavior. Normal judgment and thought content.   Urinalysis:      Assessment and Plan:  Pregnancy: G2P1001 at 327w3d1. Supervision of other normal pregnancy, antepartum Reviewed lab results from last visit. - continue  current dose of thyroid meds. Reviewed confirming the pediatrician for this baby.  2.  Heartburn Recommended Tums OTC and prescribe omeprazole if the Tums does not work.  Preterm labor symptoms and general obstetric precautions including but not limited to vaginal bleeding, contractions, leaking of fluid and fetal movement were reviewed in detail with the patient. Please refer to After Visit Summary for other counseling recommendations.  Return in about 1 week (around 11/07/2018).  TEEarlie ServerRN, MSN, NP-BC Nurse Practitioner, Fa[facility] 10/31/2018 11:08 AM

## 2018-11-07 ENCOUNTER — Ambulatory Visit (INDEPENDENT_AMBULATORY_CARE_PROVIDER_SITE_OTHER): Payer: Medicaid Other | Admitting: Obstetrics and Gynecology

## 2018-11-07 ENCOUNTER — Ambulatory Visit: Payer: Self-pay | Admitting: Clinical

## 2018-11-07 ENCOUNTER — Other Ambulatory Visit (HOSPITAL_COMMUNITY)
Admission: RE | Admit: 2018-11-07 | Discharge: 2018-11-07 | Disposition: A | Discharge status: Home / Self Care | Payer: Medicaid Other | Source: Ambulatory Visit | Attending: Obstetrics and Gynecology | Admitting: Obstetrics and Gynecology

## 2018-11-07 VITALS — BP 110/67 | Wt 163.1 lb

## 2018-11-07 DIAGNOSIS — O99283 Endocrine, nutritional and metabolic diseases complicating pregnancy, third trimester: Secondary | ICD-10-CM

## 2018-11-07 DIAGNOSIS — Z3480 Encounter for supervision of other normal pregnancy, unspecified trimester: Secondary | ICD-10-CM | POA: Diagnosis present

## 2018-11-07 DIAGNOSIS — E039 Hypothyroidism, unspecified: Secondary | ICD-10-CM

## 2018-11-07 DIAGNOSIS — Z3A36 36 weeks gestation of pregnancy: Secondary | ICD-10-CM

## 2018-11-07 DIAGNOSIS — O99282 Endocrine, nutritional and metabolic diseases complicating pregnancy, second trimester: Secondary | ICD-10-CM

## 2018-11-07 NOTE — BH Specialist Note (Signed)
error 

## 2018-11-07 NOTE — Progress Notes (Signed)
   PRENATAL VISIT NOTE  Subjective:  Heather Galloway is a 32 y.o. G2P1001 at 4w3dbeing seen today for ongoing prenatal care.  She is currently monitored for the following issues for this low-risk pregnancy and has Supervision of other normal pregnancy, antepartum; Language barrier; Hypothyroidism affecting pregnancy in second trimester; Leg cramps in pregnancy; Nausea/vomiting in pregnancy; and Nausea and vomiting during pregnancy prior to [redacted] weeks gestation on their problem list.  Patient reports no complaints.  Contractions: Not present. Vag. Bleeding: None.  Movement: Present. Denies leaking of fluid.   The following portions of the patient's history were reviewed and updated as appropriate: allergies, current medications, past family history, past medical history, past social history, past surgical history and problem list. Problem list updated.  Objective:   Vitals:   11/07/18 1332  BP: 110/67  Weight: 163 lb 1.6 oz (74 kg)    Fetal Status: Fetal Heart Rate (bpm): 154 Fundal Height: 36 cm Movement: Present     General:  Alert, oriented and cooperative. Patient is in no acute distress.  Skin: Skin is warm and dry. No rash noted.   Cardiovascular: Normal heart rate noted  Respiratory: Normal respiratory effort, no problems with respiration noted  Abdomen: Soft, gravid, appropriate for gestational age.  Pain/Pressure: Present     Pelvic: Cervical exam deferred Dilation: 1 Effacement (%): Thick Station: -3  Extremities: Normal range of motion.     Mental Status: Normal mood and affect. Normal behavior. Normal judgment and thought content.   Assessment and Plan:  Pregnancy: G2P1001 at 313w3d1. Supervision of other normal pregnancy, antepartum  - Culture, beta strep (group b only) - GC[facility]Chlamydia probe amp [facility] Last TSH level WNL    Preterm labor symptoms and general obstetric precautions including but not limited to vaginal bleeding, contractions, leaking of  fluid and fetal movement were reviewed in detail with the patient. Please refer to After Visit Summary for other counseling recommendations.  Return in about 1 week (around 11/14/2018).  Future Appointments  Date Time Provider DeCrowley11/27/2019  2:00 PM [place]12/03/2018  9:15 AM WH-MFC [place] WH-MFCUS MFC-US  11/14/2018 10:35 AM BuEphraim HamburgerTeRona RavensNP WOC-WOCA WOC  11/21/2018 10:55 AM BuEphraim HamburgerTeRona RavensNP WOC-WOCA WOC  11/28/2018  3:35 PM [place], TeRona RavensNP [facility]    JeNoni SaupeNP

## 2018-11-09 LAB — GC/CHLAMYDIA PROBE AMP (~~LOC~~) NOT AT ARMC
Chlamydia: NEGATIVE
Neisseria Gonorrhea: NEGATIVE

## 2018-11-11 LAB — CULTURE, BETA STREP (GROUP B ONLY): Strep Gp B Culture: NEGATIVE

## 2018-11-14 ENCOUNTER — Ambulatory Visit (HOSPITAL_COMMUNITY)
Admission: RE | Admit: 2018-11-14 | Discharge: 2018-11-14 | Disposition: A | Discharge status: Home / Self Care | Payer: Medicaid Other | Source: Ambulatory Visit | Attending: Nurse Practitioner | Admitting: Nurse Practitioner

## 2018-11-14 ENCOUNTER — Other Ambulatory Visit (HOSPITAL_COMMUNITY): Payer: Self-pay | Admitting: Maternal and Fetal Medicine

## 2018-11-14 ENCOUNTER — Ambulatory Visit: Payer: Medicaid Other | Admitting: Clinical

## 2018-11-14 ENCOUNTER — Ambulatory Visit (INDEPENDENT_AMBULATORY_CARE_PROVIDER_SITE_OTHER): Payer: Medicaid Other | Admitting: Nurse Practitioner

## 2018-11-14 VITALS — BP 114/76 | HR 84 | Wt 162.7 lb

## 2018-11-14 DIAGNOSIS — O99283 Endocrine, nutritional and metabolic diseases complicating pregnancy, third trimester: Secondary | ICD-10-CM | POA: Diagnosis not present

## 2018-11-14 DIAGNOSIS — Z3A37 37 weeks gestation of pregnancy: Secondary | ICD-10-CM | POA: Diagnosis not present

## 2018-11-14 DIAGNOSIS — Z3480 Encounter for supervision of other normal pregnancy, unspecified trimester: Secondary | ICD-10-CM

## 2018-11-14 DIAGNOSIS — O219 Vomiting of pregnancy, unspecified: Secondary | ICD-10-CM

## 2018-11-14 DIAGNOSIS — O99282 Endocrine, nutritional and metabolic diseases complicating pregnancy, second trimester: Secondary | ICD-10-CM

## 2018-11-14 DIAGNOSIS — E039 Hypothyroidism, unspecified: Secondary | ICD-10-CM | POA: Insufficient documentation

## 2018-11-14 DIAGNOSIS — Z362 Encounter for other antenatal screening follow-up: Secondary | ICD-10-CM | POA: Diagnosis present

## 2018-11-14 DIAGNOSIS — Z3483 Encounter for supervision of other normal pregnancy, third trimester: Secondary | ICD-10-CM

## 2018-11-14 DIAGNOSIS — O99891 Other specified diseases and conditions complicating pregnancy: Secondary | ICD-10-CM

## 2018-11-14 DIAGNOSIS — R252 Cramp and spasm: Secondary | ICD-10-CM

## 2018-11-14 DIAGNOSIS — O401XX0 Polyhydramnios, first trimester, not applicable or unspecified: Secondary | ICD-10-CM

## 2018-11-14 DIAGNOSIS — O403XX0 Polyhydramnios, third trimester, not applicable or unspecified: Secondary | ICD-10-CM | POA: Diagnosis not present

## 2018-11-14 DIAGNOSIS — O9989 Other specified diseases and conditions complicating pregnancy, childbirth and the puerperium: Secondary | ICD-10-CM

## 2018-11-14 DIAGNOSIS — Z789 Other specified health status: Secondary | ICD-10-CM

## 2018-11-14 NOTE — ED Notes (Signed)
[employer] Interpreter utilized.

## 2018-11-14 NOTE — BH Specialist Note (Signed)
Integrated Behavioral Health Initial Visit  MRN: [id number] Name: Heather Galloway  Number of [place] Clinician visits:: 1/6 Session Start time: 3:35  Session End time: 4:00 Total time: 20 minutes  Type of Service: [place] Interpretor:Yes.   Interpretor Name and Language: [place], [employer]   Warm Hand Off Completed.       SUBJECTIVE: Heather Galloway is a 32 y.o. female accompanied by n/a Patient was referred by Noni Saupe, NP for positive depression screen. Patient reports the following symptoms/concerns: Pt states her primary concern today is adjusting to pregnancy in unfamiliar surroundings, and concern about insurance and hospital stay; pt denies any other concern, and attributes positive depression screen to misunderstanding/language barrier.  Duration of problem: Current pregnancy; Severity of problem: mild  OBJECTIVE: Mood: Anxious and Affect: Appropriate Risk of harm to self or others: No plan to harm self or others  LIFE CONTEXT: Family and Social: Pt lives with her husband and 59yo son; pt's mother will be visiting to help postpartum. Local family and friends supportive School/Work: - Self-Care: - Life Changes: Current pregnancy; move to [place] from [place]  GOALS ADDRESSED: Patient will: 1. Reduce symptoms of: stress today 2. Increase knowledge and/or ability of: healthy habits  3. Demonstrate ability to: Increase healthy adjustment to current life circumstances  INTERVENTIONS: Interventions utilized: Supportive Counseling and Psychoeducation and/or Health Education  Standardized Assessments completed: GAD-7 and PHQ 9  ASSESSMENT: Patient currently experiencing Language barrier and Supervision of normal pregnancy, antepartum.   Patient may benefit from introduction to integrated behavioral health services and supportive counseling today.  PLAN: 1. Follow up with behavioral health clinician on : As  needed 2. Behavioral recommendations:  -Continue taking prenatal vitamin and iron, as prescribed by medical provider -[place] with husband and local family members who may visit hospital during labor/delivery 3. Referral(s): [place] (In Clinic) 4. "From scale of 1-10, how likely are you to follow plan?": 10  Garlan Fair, LCSW  Depression screen [facility] 2/9 10/17/2018  Decreased Interest 1  Down, Depressed, Hopeless 0  PHQ - 2 Score 1  Altered sleeping 1  Tired, decreased energy 2  Change in appetite 3  Feeling bad or failure about yourself  3  Trouble concentrating 0  Moving slowly or fidgety/restless 1  Suicidal thoughts 0  PHQ-9 Score 11   GAD 7 : Generalized Anxiety Score 10/17/2018  Nervous, Anxious, on Edge 0  Control/stop worrying 0  Worry too much - different things 0  Trouble relaxing 0  Restless 3  Easily annoyed or irritable 1  Afraid - awful might happen 0  Total GAD 7 Score 4

## 2018-11-14 NOTE — Progress Notes (Signed)
    Subjective:  Heather Galloway is a 32 y.o. G2P1001 at 82w3dbeing seen today for ongoing prenatal care.  She is currently monitored for the following issues for this low-risk pregnancy and has Supervision of other normal pregnancy, antepartum; Language barrier; Hypothyroidism affecting pregnancy in second trimester; Leg cramps in pregnancy; Nausea/vomiting in pregnancy; Nausea and vomiting during pregnancy prior to [redacted] weeks gestation; and Polyhydramnios affecting pregnancy in first trimester on their problem list.  Patient reports occasional contractions.  Contractions: Not present. Vag. Bleeding: None.  Movement: Present. Denies leaking of fluid.   The following portions of the patient's history were reviewed and updated as appropriate: allergies, current medications, past family history, past medical history, past social history, past surgical history and problem list. Problem list updated.  Objective:   Vitals:   11/14/18 1345  BP: 114/76  Pulse: 84  Weight: 162 lb 11.2 oz (73.8 kg)    Fetal Status: Fetal Heart Rate (bpm): 144 Fundal Height: 38 cm Movement: Present     General:  Alert, oriented and cooperative. Patient is in no acute distress.  Skin: Skin is warm and dry. No rash noted.   Cardiovascular: Normal heart rate noted  Respiratory: Normal respiratory effort, no problems with respiration noted  Abdomen: Soft, gravid, appropriate for gestational age. Pain/Pressure: Present     Pelvic:  Cervical exam deferred        Extremities: Normal range of motion.  Edema: Trace  Mental Status: Normal mood and affect. Normal behavior. Normal judgment and thought content.   Urinalysis:      Assessment and Plan:  Pregnancy: G2P1001 at 372w3d1. Supervision of other normal pregnancy, antepartum Doing well Digital interpreter used for the entire visit Advised patient she is now full term and expect labor anytime   2. Polyhydramnios affecting pregnancy in first trimester 95% - had NST  with BPP of 8/10 today in ultrasound Will do weekly BPP as recommended by MFM  3. Hypothyroidism affecting pregnancy in second trimester Levels are appropriate at current dosing  Term labor symptoms and general obstetric precautions including but not limited to vaginal bleeding, contractions, leaking of fluid and fetal movement were reviewed in detail with the patient. Please refer to After Visit Summary for other counseling recommendations.  Return in about 1 week (around 11/21/2018) for ROB and BPP.  TEEarlie ServerRN, MSN, NP-BC Nurse Practitioner, FaCataw[facility] Hospitalor [facility]Mal[facility] 11/14/2018 2:04 PM

## 2018-11-14 NOTE — Procedures (Signed)
Heather Galloway [birth year] [redacted]w[redacted]d Fetus A Non-Stress Test Interpretation for 11/14/18  Indication: Unsatisfactory BPP  Fetal Heart Rate A Baseline Rate (A): 125 bpm Variability: Moderate Accelerations: 15 x 15 Decelerations: None Multiple birth?: No  Uterine Activity Mode: Palpation, Toco Contraction Frequency (min): 1UC w/UI Contraction Duration (sec): 15-70 Contraction Quality: Mild Resting Tone Palpated: Relaxed Resting Time: Adequate  Interpretation (Fetal Testing) Nonstress Test Interpretation: Reactive Overall Impression: Reassuring for gestational age Comments: EFM tracing reviewed by Dr. JYates Decamp

## 2018-11-15 ENCOUNTER — Other Ambulatory Visit (HOSPITAL_COMMUNITY): Payer: Self-pay | Admitting: Maternal and Fetal Medicine

## 2018-11-15 DIAGNOSIS — O99283 Endocrine, nutritional and metabolic diseases complicating pregnancy, third trimester: Principal | ICD-10-CM

## 2018-11-15 DIAGNOSIS — E039 Hypothyroidism, unspecified: Secondary | ICD-10-CM

## 2018-11-16 ENCOUNTER — Other Ambulatory Visit (HOSPITAL_COMMUNITY): Payer: Self-pay | Admitting: *Deleted

## 2018-11-16 DIAGNOSIS — E039 Hypothyroidism, unspecified: Secondary | ICD-10-CM

## 2018-11-16 DIAGNOSIS — O99280 Endocrine, nutritional and metabolic diseases complicating pregnancy, unspecified trimester: Principal | ICD-10-CM

## 2018-11-19 ENCOUNTER — Other Ambulatory Visit: Payer: Self-pay

## 2018-11-19 NOTE — Telephone Encounter (Signed)
Pt pharmacy faxed medication refill on Ferrous Sulfate 325 mg tab. Pt will be seen on 11/21/18 will discuss if still need refill at that visit.

## 2018-11-21 ENCOUNTER — Ambulatory Visit (HOSPITAL_COMMUNITY)
Admission: RE | Admit: 2018-11-21 | Discharge: 2018-11-21 | Disposition: A | Discharge status: Home / Self Care | Payer: Medicaid Other | Source: Ambulatory Visit | Attending: Nurse Practitioner | Admitting: Nurse Practitioner

## 2018-11-21 ENCOUNTER — Ambulatory Visit (INDEPENDENT_AMBULATORY_CARE_PROVIDER_SITE_OTHER): Payer: Medicaid Other | Admitting: Nurse Practitioner

## 2018-11-21 ENCOUNTER — Encounter (HOSPITAL_COMMUNITY): Payer: Self-pay

## 2018-11-21 VITALS — BP 120/79 | HR 84 | Wt 162.7 lb

## 2018-11-21 DIAGNOSIS — O403XX0 Polyhydramnios, third trimester, not applicable or unspecified: Secondary | ICD-10-CM | POA: Diagnosis not present

## 2018-11-21 DIAGNOSIS — O99283 Endocrine, nutritional and metabolic diseases complicating pregnancy, third trimester: Secondary | ICD-10-CM

## 2018-11-21 DIAGNOSIS — O99280 Endocrine, nutritional and metabolic diseases complicating pregnancy, unspecified trimester: Secondary | ICD-10-CM

## 2018-11-21 DIAGNOSIS — E039 Hypothyroidism, unspecified: Secondary | ICD-10-CM

## 2018-11-21 DIAGNOSIS — Z3A38 38 weeks gestation of pregnancy: Secondary | ICD-10-CM | POA: Insufficient documentation

## 2018-11-21 DIAGNOSIS — Z3483 Encounter for supervision of other normal pregnancy, third trimester: Secondary | ICD-10-CM

## 2018-11-21 DIAGNOSIS — Z3480 Encounter for supervision of other normal pregnancy, unspecified trimester: Secondary | ICD-10-CM

## 2018-11-21 DIAGNOSIS — O401XX0 Polyhydramnios, first trimester, not applicable or unspecified: Secondary | ICD-10-CM

## 2018-11-21 MED ORDER — NIFEREX PO TABS: 1.0000 | ORAL_TABLET | Freq: Once | ORAL | 2 refills | Status: AC

## 2018-11-21 NOTE — Progress Notes (Signed)
    Subjective:  Heather Galloway is a 32 y.o. G2P1001 at 74w3dbeing seen today for ongoing prenatal care.  She is currently monitored for the following issues for this low-risk pregnancy and has Supervision of other normal pregnancy, antepartum; Language barrier; Hypothyroidism affecting pregnancy in second trimester; Leg cramps in pregnancy; Nausea/vomiting in pregnancy; Nausea and vomiting during pregnancy prior to [redacted] weeks gestation; and Polyhydramnios affecting pregnancy in first trimester on their problem list.  Patient reports no complaints.  Contractions: Not present. Vag. Bleeding: None.  Movement: Present. Denies leaking of fluid.   The following portions of the patient's history were reviewed and updated as appropriate: allergies, current medications, past family history, past medical history, past social history, past surgical history and problem list. Problem list updated.  Objective:   Vitals:   11/21/18 1121  BP: 120/79  Pulse: 84  Weight: 162 lb 11.2 oz (73.8 kg)    Fetal Status: Fetal Heart Rate (bpm): 138 Fundal Height: 38 cm Movement: Present     General:  Alert, oriented and cooperative. Patient is in no acute distress.  Skin: Skin is warm and dry. No rash noted.   Cardiovascular: Normal heart rate noted  Respiratory: Normal respiratory effort, no problems with respiration noted  Abdomen: Soft, gravid, appropriate for gestational age. Pain/Pressure: Present     Pelvic:  Cervical exam deferred        Extremities: Normal range of motion.  Edema: Trace  Mental Status: Normal mood and affect. Normal behavior. Normal judgment and thought content.   Urinalysis:      Assessment and Plan:  Pregnancy: G2P1001 at 336w3d1. Supervision of other normal pregnancy, antepartum Doing well.  Baby moving well.  2. Polyhydramnios affecting pregnancy in first trimester Had ultrasound this morning - results not available at visit  3. Hypothyroidism affecting pregnancy in third  trimester Stable  Term labor symptoms and general obstetric precautions including but not limited to vaginal bleeding, contractions, leaking of fluid and fetal movement were reviewed in detail with the patient. Please refer to After Visit Summary for other counseling recommendations.  Return in about 1 week (around 11/28/2018).  TEEarlie ServerRN, MSN, NP-BC Nurse Practitioner, FaB[facility]or [facility] [employer] [facility]Nov[facility] 11/21/2018 11:39 AM

## 2018-11-26 ENCOUNTER — Other Ambulatory Visit: Payer: Self-pay | Admitting: Advanced Practice Midwife

## 2018-11-28 ENCOUNTER — Encounter (HOSPITAL_COMMUNITY): Payer: Self-pay

## 2018-11-28 ENCOUNTER — Ambulatory Visit (HOSPITAL_COMMUNITY)
Admission: RE | Admit: 2018-11-28 | Discharge: 2018-11-28 | Disposition: A | Discharge status: Home / Self Care | Payer: Medicaid Other | Source: Ambulatory Visit | Attending: Nurse Practitioner | Admitting: Nurse Practitioner

## 2018-11-28 ENCOUNTER — Other Ambulatory Visit: Payer: Self-pay | Admitting: Advanced Practice Midwife

## 2018-11-28 ENCOUNTER — Ambulatory Visit (INDEPENDENT_AMBULATORY_CARE_PROVIDER_SITE_OTHER): Payer: Medicaid Other | Admitting: Nurse Practitioner

## 2018-11-28 VITALS — BP 116/78 | HR 69 | Wt 165.7 lb

## 2018-11-28 DIAGNOSIS — Z3483 Encounter for supervision of other normal pregnancy, third trimester: Secondary | ICD-10-CM

## 2018-11-28 DIAGNOSIS — O403XX0 Polyhydramnios, third trimester, not applicable or unspecified: Secondary | ICD-10-CM | POA: Diagnosis present

## 2018-11-28 DIAGNOSIS — Z3480 Encounter for supervision of other normal pregnancy, unspecified trimester: Secondary | ICD-10-CM

## 2018-11-28 DIAGNOSIS — E039 Hypothyroidism, unspecified: Secondary | ICD-10-CM | POA: Diagnosis not present

## 2018-11-28 DIAGNOSIS — O99283 Endocrine, nutritional and metabolic diseases complicating pregnancy, third trimester: Secondary | ICD-10-CM

## 2018-11-28 DIAGNOSIS — R252 Cramp and spasm: Secondary | ICD-10-CM

## 2018-11-28 DIAGNOSIS — O99280 Endocrine, nutritional and metabolic diseases complicating pregnancy, unspecified trimester: Secondary | ICD-10-CM

## 2018-11-28 DIAGNOSIS — Z3A39 39 weeks gestation of pregnancy: Secondary | ICD-10-CM | POA: Diagnosis not present

## 2018-11-28 DIAGNOSIS — O401XX0 Polyhydramnios, first trimester, not applicable or unspecified: Secondary | ICD-10-CM

## 2018-11-28 DIAGNOSIS — O99282 Endocrine, nutritional and metabolic diseases complicating pregnancy, second trimester: Secondary | ICD-10-CM

## 2018-11-28 DIAGNOSIS — O219 Vomiting of pregnancy, unspecified: Secondary | ICD-10-CM

## 2018-11-28 DIAGNOSIS — O9989 Other specified diseases and conditions complicating pregnancy, childbirth and the puerperium: Secondary | ICD-10-CM

## 2018-11-28 NOTE — Progress Notes (Signed)
    Subjective:  Heather Galloway is a 32 y.o. G2P1001 at 2w3dbeing seen today for ongoing prenatal care.  She is currently monitored for the following issues for this high-risk pregnancy and has Supervision of other normal pregnancy, antepartum; Language barrier; Hypothyroidism affecting pregnancy in second trimester; Leg cramps in pregnancy; Nausea/vomiting in pregnancy; Nausea and vomiting during pregnancy prior to [redacted] weeks gestation; and Polyhydramnios affecting pregnancy in first trimester on their problem list. Voice interpreter used for the entire visit.  Patient reports no complaints.  Contractions: Irregular. Vag. Bleeding: None.  Movement: Present. Denies leaking of fluid.   The following portions of the patient's history were reviewed and updated as appropriate: allergies, current medications, past family history, past medical history, past social history, past surgical history and problem list. Problem list updated.  Objective:   Vitals:   11/28/18 1651  BP: 116/78  Pulse: 69  Weight: 165 lb 11.2 oz (75.2 kg)    Fetal Status: Fetal Heart Rate (bpm): 132 Fundal Height: 40 cm Movement: Present     General:  Alert, oriented and cooperative. Patient is in no acute distress.  Skin: Skin is warm and dry. No rash noted.   Cardiovascular: Normal heart rate noted  Respiratory: Normal respiratory effort, no problems with respiration noted  Abdomen: Soft, gravid, appropriate for gestational age. Pain/Pressure: Present     Pelvic:  Cervical exam performed Dilation: 1 Effacement (%): 50 Station: Ballotable  Extremities: Normal range of motion.  Edema: None  Mental Status: Normal mood and affect. Normal behavior. Normal judgment and thought content.   Urinalysis:      Assessment and Plan:  Pregnancy: G2P1001 at 322w3d1. Supervision of other normal pregnancy, antepartum Wanted to be checked today.  Wanted to know when she would have her baby.  2. Polyhydramnios affecting pregnancy in  first trimester Reviewed with Dr. PrKennon Rounds will set up for induction at 40 weeks.  Scheduled for induction on 12-01-18 at 8 am.  No appointments available on 12-02-18.  The thought of induction was a bit overwhelming but explained that with the extra fluid, delivery is indicated at 40 weeks.  Client agreed to come for induction.  3. Hypothyroidism affecting pregnancy in second trimester stable  Term labor symptoms and general obstetric precautions including but not limited to vaginal bleeding, contractions, leaking of fluid and fetal movement were reviewed in detail with the patient. Please refer to After Visit Summary for other counseling recommendations.  Return for will come on 12-02-18 to [facility] at 8 am for induction.  TEEarlie ServerRN, MSN, NP-BC Nurse Practitioner, Fa[facility] Hospitalor [facility]Beaver[facility] 11/28/2018 5:36 PM

## 2018-11-29 ENCOUNTER — Other Ambulatory Visit (HOSPITAL_COMMUNITY): Payer: Self-pay | Admitting: *Deleted

## 2018-11-29 ENCOUNTER — Telehealth (HOSPITAL_COMMUNITY): Payer: Self-pay | Admitting: *Deleted

## 2018-11-29 DIAGNOSIS — O409XX0 Polyhydramnios, unspecified trimester, not applicable or unspecified: Secondary | ICD-10-CM

## 2018-11-29 NOTE — Telephone Encounter (Signed)
[phone] interpreter number  Preadmission screen

## 2018-11-29 NOTE — Telephone Encounter (Signed)
Preadmission screen  

## 2018-11-30 ENCOUNTER — Encounter (HOSPITAL_COMMUNITY): Payer: Self-pay

## 2018-12-01 ENCOUNTER — Encounter (HOSPITAL_COMMUNITY): Payer: Self-pay

## 2018-12-01 ENCOUNTER — Inpatient Hospital Stay (HOSPITAL_COMMUNITY)
Admission: RE | Admit: 2018-12-01 | Discharge: 2018-12-03 | DRG: 807 | Disposition: A | Discharge status: Home / Self Care | Payer: Medicaid Other | Attending: Obstetrics and Gynecology | Admitting: Obstetrics and Gynecology

## 2018-12-01 DIAGNOSIS — D649 Anemia, unspecified: Secondary | ICD-10-CM | POA: Diagnosis present

## 2018-12-01 DIAGNOSIS — O409XX0 Polyhydramnios, unspecified trimester, not applicable or unspecified: Secondary | ICD-10-CM | POA: Diagnosis present

## 2018-12-01 DIAGNOSIS — O99284 Endocrine, nutritional and metabolic diseases complicating childbirth: Secondary | ICD-10-CM | POA: Diagnosis present

## 2018-12-01 DIAGNOSIS — O403XX0 Polyhydramnios, third trimester, not applicable or unspecified: Principal | ICD-10-CM | POA: Diagnosis present

## 2018-12-01 DIAGNOSIS — R252 Cramp and spasm: Secondary | ICD-10-CM

## 2018-12-01 DIAGNOSIS — Z3480 Encounter for supervision of other normal pregnancy, unspecified trimester: Secondary | ICD-10-CM

## 2018-12-01 DIAGNOSIS — O9902 Anemia complicating childbirth: Secondary | ICD-10-CM | POA: Diagnosis present

## 2018-12-01 DIAGNOSIS — Z3A39 39 weeks gestation of pregnancy: Secondary | ICD-10-CM

## 2018-12-01 DIAGNOSIS — O99282 Endocrine, nutritional and metabolic diseases complicating pregnancy, second trimester: Secondary | ICD-10-CM

## 2018-12-01 DIAGNOSIS — O9989 Other specified diseases and conditions complicating pregnancy, childbirth and the puerperium: Secondary | ICD-10-CM

## 2018-12-01 DIAGNOSIS — O219 Vomiting of pregnancy, unspecified: Secondary | ICD-10-CM

## 2018-12-01 DIAGNOSIS — E039 Hypothyroidism, unspecified: Secondary | ICD-10-CM | POA: Diagnosis present

## 2018-12-01 LAB — CBC
HEMATOCRIT: 37.7 % (ref 36.0–46.0)
Hemoglobin: 12.8 g/dL (ref 12.0–15.0)
MCH: 29.5 pg (ref 26.0–34.0)
MCHC: 34 g/dL (ref 30.0–36.0)
MCV: 86.9 fL (ref 80.0–100.0)
Platelets: 244 10*3/uL (ref 150–400)
RBC: 4.34 MIL/uL (ref 3.87–5.11)
RDW: 14.5 % (ref 11.5–15.5)
WBC: 11 10*3/uL — ABNORMAL HIGH (ref 4.0–10.5)
nRBC: 0.3 % — ABNORMAL HIGH (ref 0.0–0.2)

## 2018-12-01 LAB — TYPE AND SCREEN
ABO/RH(D): B POS
Antibody Screen: NEGATIVE

## 2018-12-01 MED ORDER — LACTATED RINGERS IV SOLN
INTRAVENOUS | Status: DC
  Administered 2018-12-01: 125 mL/h via INTRAVENOUS

## 2018-12-01 MED ORDER — NALOXONE HCL 0.4 MG/ML IJ SOLN
INTRAMUSCULAR | Status: AC
  Filled 2018-12-01: qty 1

## 2018-12-01 MED ORDER — FENTANYL CITRATE (PF) 100 MCG/2ML IJ SOLN
100.0000 ug | INTRAMUSCULAR | Status: DC | PRN
  Administered 2018-12-01 (×2): 100 ug via INTRAVENOUS
  Filled 2018-12-01: qty 2

## 2018-12-01 MED ORDER — SOD CITRATE-CITRIC ACID 500-334 MG/5ML PO SOLN: 30.0000 mL | ORAL | Status: DC | PRN

## 2018-12-01 MED ORDER — MISOPROSTOL 50MCG HALF TABLET
50.0000 ug | ORAL_TABLET | ORAL | Status: DC
  Administered 2018-12-01: 50 ug via ORAL
  Filled 2018-12-01 (×4): qty 1

## 2018-12-01 MED ORDER — OXYTOCIN BOLUS FROM INFUSION
500.0000 mL | Freq: Once | INTRAVENOUS | Status: AC
  Administered 2018-12-01: 500 mL via INTRAVENOUS

## 2018-12-01 MED ORDER — OXYTOCIN 40 UNITS IN LACTATED RINGERS INFUSION - SIMPLE MED
1.0000 m[IU]/min | INTRAVENOUS | Status: DC
  Administered 2018-12-01: 2 m[IU]/min via INTRAVENOUS
  Filled 2018-12-01: qty 1000

## 2018-12-01 MED ORDER — OXYTOCIN 40 UNITS IN LACTATED RINGERS INFUSION - SIMPLE MED: 2.5000 [IU]/h | INTRAVENOUS | Status: DC

## 2018-12-01 MED ORDER — ACETAMINOPHEN 325 MG PO TABS: 650.0000 mg | ORAL_TABLET | ORAL | Status: DC | PRN

## 2018-12-01 MED ORDER — ONDANSETRON HCL 4 MG/2ML IJ SOLN: 4.0000 mg | Freq: Four times a day (QID) | INTRAMUSCULAR | Status: DC | PRN

## 2018-12-01 MED ORDER — OXYCODONE-ACETAMINOPHEN 5-325 MG PO TABS: 1.0000 | ORAL_TABLET | ORAL | Status: DC | PRN

## 2018-12-01 MED ORDER — LIDOCAINE HCL (PF) 1 % IJ SOLN
30.0000 mL | INTRAMUSCULAR | Status: DC | PRN
  Administered 2018-12-01: 30 mL via SUBCUTANEOUS
  Filled 2018-12-01: qty 30

## 2018-12-01 MED ORDER — FLEET ENEMA 7-19 GM/118ML RE ENEM: 1.0000 | ENEMA | RECTAL | Status: DC | PRN

## 2018-12-01 MED ORDER — FENTANYL CITRATE (PF) 100 MCG/2ML IJ SOLN
INTRAMUSCULAR | Status: AC
  Administered 2018-12-01: 100 ug via INTRAVENOUS
  Filled 2018-12-01: qty 2

## 2018-12-01 MED ORDER — TERBUTALINE SULFATE 1 MG/ML IJ SOLN
0.2500 mg | Freq: Once | INTRAMUSCULAR | Status: DC | PRN
  Filled 2018-12-01: qty 1

## 2018-12-01 MED ORDER — OXYCODONE-ACETAMINOPHEN 5-325 MG PO TABS: 2.0000 | ORAL_TABLET | ORAL | Status: DC | PRN

## 2018-12-01 MED ORDER — LACTATED RINGERS IV SOLN: 500.0000 mL | INTRAVENOUS | Status: DC | PRN

## 2018-12-01 NOTE — Progress Notes (Signed)
Used interpreter [phone]#[id number], [place] language to admit patient.

## 2018-12-01 NOTE — Anesthesia Pain Management Evaluation Note (Signed)
  CRNA Pain Management Visit Note  Patient: Heather Galloway, 32 y.o., female  "Hello I am a member of the anesthesia team at [facility]. We have an anesthesia team available at all times to provide care throughout the hospital, including epidural management and anesthesia for C-section. I don't know your plan for the delivery whether it a natural birth, water birth, IV sedation, nitrous supplementation, doula or epidural, but we want to meet your pain goals."   1.Was your pain managed to your expectations on prior hospitalizations?   No prior hospitalizations  2.What is your expectation for pain management during this hospitalization?     Desires natural delivery  3.How can we help you reach that goal?   Record the patient's initial score and the patient's pain goal.   Pain: 4  Pain Goal: 10 [facility] wants you to be able to say your pain was always managed very well.  Jabier Mutton 12/01/2018

## 2018-12-01 NOTE — Plan of Care (Signed)
POC discussed with pt and support person, understanding verbalized.

## 2018-12-01 NOTE — H&P (Signed)
LABOR AND DELIVERY ADMISSION HISTORY AND PHYSICAL NOTE  Heather Galloway is a 32 y.o. female G2P1001 with IUP at 24w6dby 21w [place] for IOL for polyhydramnios.  She reports positive fetal movement. She denies leakage of fluid or vaginal bleeding.  [facility]:  - poly[facility] - hypothyroidism - anemia  Past Medical History: Past Medical History:  Diagnosis Date  . Hyperthyroidism     Past Surgical History: Past Surgical History:  Procedure Laterality Date  . NO PAST SURGERIES      Obstetrical History: OB History    Gravida  2   Para  1   Term  1   Preterm      AB      Living  1     SAB      TAB      Ectopic      Multiple      Live Births  1           Social History: Social History   Socioeconomic History  . Marital status: Married    Spouse name: Not on file  . Number of children: Not on file  . Years of education: Not on file  . Highest education level: Not on file  Occupational History  . Not on file  Social Needs  . Financial resource strain: Not on file  . Food insecurity:    Worry: Not on file    Inability: Not on file  . Transportation needs:    Medical: Not on file    Non-medical: Not on file  Tobacco Use  . Smoking status: Never Smoker  . Smokeless tobacco: Never Used  Substance and Sexual Activity  . Alcohol use: Never    Frequency: Never  . Drug use: Never  . Sexual activity: Yes    Birth control/protection: None  Lifestyle  . Physical activity:    Days per week: Not on file    Minutes per session: Not on file  . Stress: Not on file  Relationships  . Social connections:    Talks on phone: Not on file    Gets together: Not on file    Attends religious service: Not on file    Active member of club or organization: Not on file    Attends meetings of clubs or organizations: Not on file    Relationship status: Not on file  Other Topics Concern  . Not on file   Social History Narrative  . Not on file    Family History: History reviewed. No pertinent family history.  Allergies: No Known Allergies  Medications Prior to Admission  Medication Sig Dispense Refill Last Dose  . ferrous sulfate 325 (65 FE) MG tablet Take 1 tablet (325 mg total) by mouth daily. 30 tablet 0 Taking  . levothyroxine (SYNTHROID, LEVOTHROID) 137 MCG tablet TAKE 1 TABLET (137 MCG TOTAL) BY MOUTH DAILY BEFORE BREAKFAST. 30 tablet 1 Taking  . Magnesium 200 MG TABS Take 2 tablets (400 mg total) by mouth at bedtime as needed. (Patient taking differently: Take 200 mg by mouth at bedtime. ) 30 each 1 Taking  . metoCLOPramide (REGLAN) 10 MG tablet Take 1 tablet (10 mg total) by mouth every 8 (eight) hours as needed for nausea. (Patient not taking: Reported on 11/28/2018) 30 tablet 1 Not Taking  . omeprazole (PRILOSEC) 40 MG capsule Take 1 capsule (40 mg total) by mouth daily. 30 capsule 2 Taking  . Prenatal Vit-Fe Fumarate-FA (PREPLUS) 27-1  MG TABS Take 1 tablet by mouth daily. 30 tablet 13 Taking     Review of Systems  All systems reviewed and negative except as stated in HPI  Physical Exam Blood pressure 118/80, pulse 79, temperature 98.4 F (36.9 C), temperature source Oral, resp. rate 16, height _0  (1.549 m), weight 76.1 kg, last menstrual period 03/08/2018. General appearance: alert, oriented, NAD Lungs: normal respiratory effort Heart: regular rate Abdomen: soft, non-tender; gravid, FH appropriate for GA Extremities: No calf swelling or tenderness Presentation: cephalic Fetal monitoring: 130s/mod var/+ acels/no decels Uterine activity: irritability Dilation: 2 Exam by:: Dr. Hardin Negus  Prenatal labs: ABO, Rh: --/--/B POS (12/21 0900) Antibody: NEG (12/21 0900) Rubella: 4.34 (06/28 1550) RPR: Non Reactive (09/25 0902)  HBsAg: Negative (06/28 1550)  HIV: Non Reactive (09/25 0902)  GC/Chlamydia: negative GBS:   negative 2-hr GTT: 76/145/140 Genetic  screening:  Low risk Anatomy US: WNL  Prenatal Transfer Tool  Maternal Diabetes: No Genetic Screening: Normal Maternal Ultrasounds/Referrals: Normal Fetal Ultrasounds or other Referrals:  None Maternal Substance Abuse:  No Significant Maternal Medications:  None Significant Maternal Lab Results: None  Results for orders placed or performed during the hospital encounter of 12/01/18 (from the past 24 hour(s))  CBC   Collection Time: 12/01/18  9:00 AM  Result Value Ref Range   WBC 11.0 (H) 4.0 - 10.5 K/uL   RBC 4.34 3.87 - 5.11 MIL/uL   Hemoglobin 12.8 12.0 - 15.0 g/dL   HCT 37.7 36.0 - 46.0 %   MCV 86.9 80.0 - 100.0 fL   MCH 29.5 26.0 - 34.0 pg   MCHC 34.0 30.0 - 36.0 g/dL   RDW 14.5 11.5 - 15.5 %   Platelets 244 150 - 400 K/uL   nRBC 0.3 (H) 0.0 - 0.2 %  Type and screen   Collection Time: 12/01/18  9:00 AM  Result Value Ref Range   ABO/RH(D) B POS    Antibody Screen NEG    Sample Expiration      12/04/2018 Performed at [facility], [address] [place], [place] [address]     Patient Active Problem List   Diagnosis Date Noted  . Polyhydramnios 12/01/2018  . Polyhydramnios affecting pregnancy in first trimester 11/14/2018  . Nausea/vomiting in pregnancy 09/25/2018  . Nausea and vomiting during pregnancy prior to [redacted] weeks gestation 09/25/2018  . Leg cramps in pregnancy 09/05/2018  . Hypothyroidism affecting pregnancy in second trimester 07/08/2018  . Language barrier 06/09/2018  . Supervision of other normal pregnancy, antepartum 06/08/2018    Assessment: Heather Galloway is a 32 y.o. G2P1001 at 63w6dhere for IOL for polyhydramnios  #Labor: start labor with FB and cytotec #Pain: Desires epidural #FWB: Cat 1 #ID:  GBS neg #MOF: breast #MOC: undecided #Circ: yes (inpatient v outpatient)  NAura Camps MD  Ob Fellow 12/01/2018, 3:19 PM

## 2018-12-02 LAB — CBC
HEMATOCRIT: 36.7 % (ref 36.0–46.0)
Hemoglobin: 12.4 g/dL (ref 12.0–15.0)
MCH: 29.9 pg (ref 26.0–34.0)
MCHC: 33.8 g/dL (ref 30.0–36.0)
MCV: 88.4 fL (ref 80.0–100.0)
Platelets: 209 10*3/uL (ref 150–400)
RBC: 4.15 MIL/uL (ref 3.87–5.11)
RDW: 14.5 % (ref 11.5–15.5)
WBC: 14 10*3/uL — ABNORMAL HIGH (ref 4.0–10.5)
nRBC: 0 % (ref 0.0–0.2)

## 2018-12-02 LAB — RPR: RPR Ser Ql: NONREACTIVE

## 2018-12-02 MED ORDER — ONDANSETRON HCL 4 MG/2ML IJ SOLN: 4.0000 mg | INTRAMUSCULAR | Status: DC | PRN

## 2018-12-02 MED ORDER — DIPHENHYDRAMINE HCL 25 MG PO CAPS: 25.0000 mg | ORAL_CAPSULE | Freq: Four times a day (QID) | ORAL | Status: DC | PRN

## 2018-12-02 MED ORDER — ACETAMINOPHEN 325 MG PO TABS
650.0000 mg | ORAL_TABLET | ORAL | Status: DC | PRN
  Administered 2018-12-02 (×2): 650 mg via ORAL
  Filled 2018-12-02 (×2): qty 2

## 2018-12-02 MED ORDER — ZOLPIDEM TARTRATE 5 MG PO TABS: 5.0000 mg | ORAL_TABLET | Freq: Every evening | ORAL | Status: DC | PRN

## 2018-12-02 MED ORDER — TETANUS-DIPHTH-ACELL PERTUSSIS 5-2.5-18.5 LF-MCG/0.5 IM SUSP: 0.5000 mL | Freq: Once | INTRAMUSCULAR | Status: DC

## 2018-12-02 MED ORDER — SIMETHICONE 80 MG PO CHEW
80.0000 mg | CHEWABLE_TABLET | ORAL | Status: DC | PRN
  Administered 2018-12-03: 80 mg via ORAL
  Filled 2018-12-02: qty 1

## 2018-12-02 MED ORDER — WITCH HAZEL-GLYCERIN EX PADS: 1.0000 "application " | MEDICATED_PAD | CUTANEOUS | Status: DC | PRN

## 2018-12-02 MED ORDER — COCONUT OIL OIL
1.0000 "application " | TOPICAL_OIL | Status: DC | PRN
  Filled 2018-12-02: qty 120

## 2018-12-02 MED ORDER — DIBUCAINE 1 % RE OINT: 1.0000 "application " | TOPICAL_OINTMENT | RECTAL | Status: DC | PRN

## 2018-12-02 MED ORDER — BENZOCAINE-MENTHOL 20-0.5 % EX AERO
1.0000 "application " | INHALATION_SPRAY | CUTANEOUS | Status: DC | PRN
  Administered 2018-12-02 – 2018-12-03 (×2): 1 via TOPICAL
  Filled 2018-12-02 (×2): qty 56

## 2018-12-02 MED ORDER — PRENATAL MULTIVITAMIN CH
1.0000 | ORAL_TABLET | Freq: Every day | ORAL | Status: DC
  Administered 2018-12-02 – 2018-12-03 (×2): 1 via ORAL
  Filled 2018-12-02 (×2): qty 1

## 2018-12-02 MED ORDER — IBUPROFEN 600 MG PO TABS
600.0000 mg | ORAL_TABLET | Freq: Four times a day (QID) | ORAL | Status: DC
  Administered 2018-12-02 – 2018-12-03 (×7): 600 mg via ORAL
  Filled 2018-12-02 (×7): qty 1

## 2018-12-02 MED ORDER — SENNOSIDES-DOCUSATE SODIUM 8.6-50 MG PO TABS
2.0000 | ORAL_TABLET | ORAL | Status: DC
  Administered 2018-12-02 – 2018-12-03 (×2): 2 via ORAL
  Filled 2018-12-02 (×2): qty 2

## 2018-12-02 MED ORDER — ONDANSETRON HCL 4 MG PO TABS: 4.0000 mg | ORAL_TABLET | ORAL | Status: DC | PRN

## 2018-12-02 NOTE — Lactation Note (Addendum)
This note was copied from a baby's chart. Lactation Consultation Note  Patient Name: Heather Galloway [id number] Date: 12/02/2018 Reason for consult: Initial assessment;Term P2, 6 hour female infant. BF concerns: Mom has short shafted semi-flat nipples, 20 mm NS usage and infant lifts tongue to roof of mouth. Per mom she breastfeed her eldest son for 2 years. Mom aware nipples her short shafted and semi flat she used 20 mm NS previously with eldest son in beginning. LC assisted mom in latching infant to breast and infant would not sustain latch at first without NS. LC did suck training infant sucking on gloved finger, lower tongue down and extended lower jaw.  Mom latched infant in football hold using 20 mm NS, LC repeated ask mom bring infant closer to breast " nose to breast" infant started suckling with audible swallows. Infant breastfeeding for 15 minutes and still breastfeeding as [place] left room. Mom will wear breast shells in bra during day and knows not to sleep in them at night to help elongate nipple shaft out more for infant have deeper latch. Mom knows how to apply 20 mm NS to breast with hx of previous use with older child. Mom shown how to use DEBP & how to disassemble, clean, & reassemble parts. Mom will use DEBP every 3 hours for 15 minutes each. Mom will breastfeeding according hunger cues , 8 to 12 times within 24 hours including nights and mom will not exceed 3 hours without putting infant to breast. LC discussed I & O. Mom made aware of O/P services, breastfeeding support groups, community resources, and our phone # for post-discharge questions.  Mom's BF plan: 1. Mom will BF according hunger cues and not exceed 3 hours without breastfeeding. 2. Mom will wear 20 mm NS when latching infant to breast. 3. Mom will wear breast shells during the day to help elongate nipple shaft out more. 4. Mom will pump every 3 hours for 15 minutes. 60. Mom will call Nurse or [place] if she has any further  questions, concerns or need assistance with latching infant to breast.  Maternal Data Formula Feeding for Exclusion: No Has patient been taught Hand Expression?: Yes(Mom has colostrum present both breast.) Does the patient have breastfeeding experience prior to this delivery?: Yes  Feeding Feeding Type: Breast Fed  LATCH Score Latch: Repeated attempts needed to sustain latch, nipple held in mouth throughout feeding, stimulation needed to elicit sucking reflex.  Audible Swallowing: A few with stimulation  Type of Nipple: Everted at rest and after stimulation  Comfort (Breast/Nipple): Soft / non-tender  Hold (Positioning): Assistance needed to correctly position infant at breast and maintain latch.  LATCH Score: 7  Interventions Interventions: Breast feeding basics reviewed;Assisted with latch;Skin to skin;Breast compression;Position options;Adjust position;Breast massage;Hand express;DEBP  Lactation Tools Discussed/Used Tools: Shells;Pump;Nipple Shields Nipple shield size: 20 Shell Type: Other (comment)(short shafted and semi-flat.) Breast pump type: Double-Electric Breast Pump Pump Review: Setup, frequency, and cleaning;Milk Storage Initiated by:: Vicente Serene, IBCLC Date initiated:: 12/02/18   Consult Status Consult Status: Follow-up Date: 12/02/18 Follow-up type: In-patient    Vicente Serene 12/02/2018, 5:08 AM

## 2018-12-02 NOTE — Lactation Note (Signed)
This note was copied from a baby's chart. Lactation Consultation Note  Patient Name: [name] [id number] Date: 12/02/2018 Reason for consult: Follow-up assessment;Term  P2 mother whose infant is now 29 hours old.  Mother breast fed her first child for 2 years.  [place] audio interpreter used [phone]) for interpretation  Mother was breast feeding in the cradle hold on the left breast when I arrived.  She has clothes and a loose swaddle on baby.  He was sucking but the latch was shallow.  Mother denied pain with latching.  I suggested she remove swaddle and clothing and feed STS but she was not interested.  She did not want any assistance with breast feeding.  Encouraged to feed 8-12 times/24 hours or sooner if baby shows feeding cues.  Reviewed feeding cues.  Discussed colostrum, hand expression (which mother was familiar with), how to finger feed/spoon feed EBM back to baby and supply and demand.  Encouraged her to call for latch assistance as needed from [place].  Spoon at bedside for feeding.  Mother has a DEBP set up at bedside and had no questions on how to use it.  Mother asked about using her lanolin cream that she has at bedside.  I discussed the cons about lanolin and suggested coconut oil instead.  RN will bring this to mother.  Mom made aware of O/P services, breastfeeding support groups, community resources, and our phone # for post-discharge questions. Mother will be a "stay at home" mother after maternity leave. Family in room and supportive.    Maternal Data Formula Feeding for Exclusion: No Has patient been taught Hand Expression?: Yes Does the patient have breastfeeding experience prior to this delivery?: Yes  Feeding Feeding Type: Breast Fed  LATCH Score Latch: Grasps breast easily, tongue down, lips flanged, rhythmical sucking.  Audible Swallowing: None  Type of Nipple: Everted at rest and after stimulation(short shafted)  Comfort (Breast/Nipple): Soft /  non-tender  Hold (Positioning): Assistance needed to correctly position infant at breast and maintain latch.  LATCH Score: 7  Interventions Interventions: Breast feeding basics reviewed;Skin to skin;Hand express;Breast compression;Hand pump;Shells;Coconut oil;Expressed milk;Position options;Support pillows;Adjust position;DEBP  Lactation Tools Discussed/Used Tools: Shells;Pump Shell Type: Inverted WIC Program: No Initiated by:: Already initiated   Consult Status Consult Status: Follow-up Date: 12/03/18 Follow-up type: In-patient    Heather Galloway R Heather Galloway 12/02/2018, 4:20 PM

## 2018-12-02 NOTE — Progress Notes (Signed)
Post Partum Day 1  Subjective: no complaints, up ad lib, voiding, tolerating PO and + flatus  Objective: Blood pressure 110/65, pulse 60, temperature 98.2 F (36.8 C), temperature source Oral, resp. rate 16, height 5\' 1"  (1.549 m), weight 76.1 kg, last menstrual period 03/08/2018, SpO2 98 %, unknown if currently breastfeeding.  Physical Exam:  General: alert, cooperative and no distress Lochia: appropriate Uterine Fundus: firm Incision: n/a DVT Evaluation: No evidence of DVT seen on physical exam.  Recent Labs    12/01/18 0900 12/02/18 0530  HGB 12.8 12.4  HCT 37.7 36.7    Assessment/Plan: Plan for discharge tomorrow and Breastfeeding   LOS: 1 day   Rolm BookbinderCaroline M Maisee Vollman CNM 12/02/2018, 7:12 AM

## 2018-12-03 MED ORDER — ACETAMINOPHEN 325 MG PO TABS: 650.0000 mg | ORAL_TABLET | ORAL | 0 refills | Status: DC | PRN

## 2018-12-03 MED ORDER — SENNOSIDES-DOCUSATE SODIUM 8.6-50 MG PO TABS: 2.0000 | ORAL_TABLET | ORAL | 0 refills | Status: DC

## 2018-12-03 MED ORDER — IBUPROFEN 600 MG PO TABS: 600.0000 mg | ORAL_TABLET | Freq: Four times a day (QID) | ORAL | 0 refills | Status: DC

## 2018-12-03 MED ORDER — SIMETHICONE 80 MG PO CHEW: 80.0000 mg | CHEWABLE_TABLET | ORAL | 0 refills | Status: DC | PRN

## 2018-12-03 NOTE — Discharge Summary (Addendum)
Postpartum Discharge Summary     Patient Name: Heather Galloway DOB: Sep 01, 1986 MRN: [id number]  Date of admission: 12/01/2018 Delivering Provider: Wende Mott   Date of discharge: 12/03/2018  Admitting diagnosis: 21 WKS 6 DAYS INCUTION Intrauterine pregnancy: [redacted]w[redacted]d    Secondary diagnosis:  Active Problems:   Polyhydramnios  Additional problems: hypothyroidism     Discharge diagnosis: Preterm Pregnancy Delivered                                                                                                Post partum procedures:NA  Augmentation: Cytotec  Complications: None  Hospital course:  Induction of Labor With Vaginal Delivery   32y.o. yo G2P2002 at 362w6das admitted to the hospital 12/01/2018 for induction of labor.  Indication for induction: polyhydramnios.  Patient had an uncomplicated labor course as follows: Membrane Rupture Time/Date: 9:10 PM ,12/01/2018   Intrapartum Procedures: Episiotomy: None [1]                                         Lacerations:  2nd degree [3]  Patient had delivery of a Viable infant.  Information for the patient's newborn:  UlMerelin, Human0[[id number]]    12/01/2018  Details of delivery can be found in separate delivery note.  Patient had a routine postpartum course. Patient is discharged home 12/03/18.  Magnesium Sulfate recieved: No BMZ received: No  Physical exam  Vitals:   12/02/18 0915 12/02/18 1428 12/02/18 2333 12/03/18 0522  BP: (!) 105/56 101/73 106/66 106/73  Pulse: 72 67 62 60  Resp: '18 16 18 16  '$ Temp: 98.3 F (36.8 C) 98.4 F (36.9 C) 98 F (36.7 C) 97.6 F (36.4 C)  TempSrc: Oral Oral Oral Oral  SpO2: 99%     Weight:      Height:       General: alert, resting in bed, NAD Lochia: appropriate Uterine Fundus: firm Incision: N/A DVT Evaluation: No evidence of DVT seen on physical exam. Labs: Lab Results  Component Value Date   WBC 14.0 (H) 12/02/2018   HGB 12.4 12/02/2018   HCT 36.7 12/02/2018    MCV 88.4 12/02/2018   PLT 209 12/02/2018   No flowsheet data found.  Discharge instruction: per After Visit Summary and "Baby and Me Booklet".  After visit meds:  Allergies as of 12/03/2018   No Known Allergies     Medication List    STOP taking these medications   ferrous sulfate 325 (65 FE) MG tablet   Magnesium 200 MG Tabs   metoCLOPramide 10 MG tablet Commonly known as:  REGLAN   omeprazole 40 MG capsule Commonly known as:  PRILOSEC     TAKE these medications   acetaminophen 325 MG tablet Commonly known as:  TYLENOL Take 2 tablets (650 mg total) by mouth every 4 (four) hours as needed (for pain scale < 4).   ibuprofen 600 MG tablet Commonly known as:  ADVIL,MOTRIN Take 1 tablet (600 mg total)  by mouth every 6 (six) hours.   levothyroxine 137 MCG tablet Commonly known as:  SYNTHROID, LEVOTHROID TAKE 1 TABLET (137 MCG TOTAL) BY MOUTH DAILY BEFORE BREAKFAST.   PREPLUS 27-1 MG Tabs Take 1 tablet by mouth daily.   senna-docusate 8.6-50 MG tablet Commonly known as:  Senokot-S Take 2 tablets by mouth daily. Start taking on:  December 04, 2018   simethicone 80 MG chewable tablet Commonly known as:  MYLICON Chew 1 tablet (80 mg total) by mouth as needed for flatulence.       Diet: routine diet  Activity: Advance as tolerated. Pelvic rest for 6 weeks.   Outpatient follow up:4 weeks Follow up Appt: Future Appointments  Date Time Provider Aneth  12/04/2018  9:30 AM WH-MFC [place] 5 WH-MFCUS MFC-US  12/14/2018 12:30 PM WH-MFC [place] 1 WH-MFCUS MFC-US   Follow up Visit:   Please schedule this patient for Postpartum visit in: 4 weeks with the following provider: Any provider For C/S patients schedule nurse incision check in weeks 2 weeks: no High risk pregnancy complicated by: hypothyroidism, polyhydramnios Delivery mode:  SVD Anticipated Birth Control:  other/unsure PP Procedures needed: NA  Schedule Integrated Nectar visit: no      Newborn  Data: Live born female  Birth Weight: 7 lb 4 oz (3289 g) APGAR: 9, 9  Newborn Delivery   Birth date/time:  12/01/2018 22:09:00 Delivery type:  Vaginal, Spontaneous     Baby Feeding: Breast Disposition:home with mother   12/03/2018 Earlie Raveling, MD  I confirm that I have verified the information documented in the resident's note and that I have also personally reperformed the physical exam and all medical decision making activities.   Wende Mott, CNM 12/03/18 11:04 PM

## 2018-12-03 NOTE — Lactation Note (Signed)
This note was copied from a baby's chart. Lactation Consultation Note  Patient Name: Heather Galloway [id number] Date: 12/03/2018 Reason for consult: Follow-up assessment Telephone interpreter used for visit.  Mom states baby ate every hour during the night.  Discussed cluster feeding.  She reports baby is latching well.  Questions answered.  Discussed milk coming to volume and the prevention and treatment of engorgement.  Manual pump given with instructions.  Encouraged to call for concerns prn.  Maternal Data    Feeding Feeding Type: Breast Fed  LATCH Score                   Interventions    Lactation Tools Discussed/Used     Consult Status Consult Status: Complete Follow-up type: Call as needed    Ave Filter 12/03/2018, 9:50 AM

## 2018-12-03 NOTE — Progress Notes (Signed)
Interpreter ID [id number]

## 2018-12-04 ENCOUNTER — Encounter (HOSPITAL_COMMUNITY): Payer: Self-pay

## 2018-12-04 ENCOUNTER — Ambulatory Visit (HOSPITAL_COMMUNITY): Admission: RE | Admit: 2018-12-04 | Payer: Medicaid Other | Source: Ambulatory Visit

## 2018-12-14 ENCOUNTER — Encounter (HOSPITAL_COMMUNITY): Payer: Self-pay

## 2018-12-14 ENCOUNTER — Ambulatory Visit (HOSPITAL_COMMUNITY): Payer: Medicaid Other

## 2018-12-19 ENCOUNTER — Other Ambulatory Visit: Payer: Self-pay | Admitting: Advanced Practice Midwife

## 2018-12-31 ENCOUNTER — Encounter: Payer: Self-pay | Admitting: Student

## 2018-12-31 ENCOUNTER — Ambulatory Visit (INDEPENDENT_AMBULATORY_CARE_PROVIDER_SITE_OTHER): Payer: Medicaid Other | Admitting: Student

## 2018-12-31 DIAGNOSIS — Z1389 Encounter for screening for other disorder: Secondary | ICD-10-CM

## 2018-12-31 DIAGNOSIS — E039 Hypothyroidism, unspecified: Secondary | ICD-10-CM

## 2018-12-31 MED ORDER — LEVOTHYROXINE SODIUM 100 MCG PO TABS: 100.0000 ug | ORAL_TABLET | Freq: Every day | ORAL | 1 refills | Status: DC

## 2018-12-31 NOTE — Patient Instructions (Addendum)
[place] (Revised August 2014)    Insufficient Money for Medicine:           [place] [address]: call "211"   . MAP Program at [place] [phone] or [place] [phone]            No Primary Care Doctor:  To locate a primary care doctor that accepts your insurance or provides certain services:           [place]: [phone]           Physician Referral Service: [phone] ask for "My [place]" . If no insurance, you need to see if you qualify for [facility] "orange card", call to set      up appointment for eligibility/enrollment at [phone] or [phone] or visit [place] ([address], [place] and [place]) to meet with a [facility] enrollment specialist.  Agencies that provide inexpensive (sliding fee scale) medical care:   Marland Kitchen    [facility] - Family Medicine at [facility] .    [facility] and [place] [phone] .    [facility] Internal Medicine - [phone] .    [place] [phone] .    [facility] for [place] [phone] .    [place] [phone] . [facility] - [place] @ [place] [phone]     [phone] . [facility] - [place] @ [place] [phone] . [facility]: [phone]  . Women's Clinic: [phone]  . [facility]: [phone]  . [facility] [place] Providers:           [facility] - [phone] (No Family Planning accepted)          [address], [phone], Mon-Fri 9am-5pm          [facility] [phone] . [place], [address], [place] 8am-5pm, Fri 8am-noon . [place], [address], Mon-Fri 7:30am-4:30pm          [facility] - [phone]          [address], [place] [phone] [address], [address]          Only accepts [employer] patients after they have their name applied to their card  Self Pay (no insurance) in [facility]:           Sickle Cell Patients:  . [place], [phone] [place] [facility]: . [address] [place] [phone]       [facility] . [place] [phone]  [facility]: . [address] [phone]          [facility] Urgent Care           [place], [phone] [facility] . [place], (  [phone]           [facility]gent [facility] [place]           [place], [address], [place] [address]           [phone], Mon-Fri 9am-7pm, [place] 9am-1pm          [facility] Family Medicine @ [place], Susan Moore          [facility] - [facility]           [address] [place] [facility] - [place] . [place] [phone]          [facility]           [address] [place]  [facility] - [place]  . [place], [phone] [facility] - [place] . [address], [phone]  Dr. [place] Lawman           [address], [place], [facility]           [address], [phone]          [facility]             [address]  [phone]          [facility]           [place], [place], 1st & 3rd Saturday every month, 10am-1pm OTHERS:  [place]  ([facility] Only)  [phone] (Thursday only) Strategies for finding a Primary Care Provider:  1) Find a Doctor and Pay Out of Pocket  Although you won't have to find out who is covered by your insurance plan, it is a good idea to ask around and get recommendations. You will then need to call the office and see if the doctor you have chosen will accept you as a new patient and what types of options they offer for patients who are self-pay. Some doctors offer discounts or will set up payment plans for their patients who do not have insurance, but you will need to ask so you aren't surprised when you get to your appointment.  2) [place] - To see if you qualify for "orange card" access to healthcare safety net providers.  Call for appointment for eligibility/enrollment at [phone] or [phone]. (Uninsured, 0-200% FPL, qualifying info)  Applicants for [facility] are first required to see if they are eligible to enroll in the [facility] Marketplace before enrolling in [facility] (and get an exemption if they are not).  GCCN Criteria for acceptance is:  ? Proof of [employer] exemption - form or documentation  ? Valid photo ID (driver's license, state identification card, passport, home country ID)  ? Proof of [facility] residency (e.g. driver's license, lease/landlord information, pay stubs with address, utility bill, bank statement, etc.)  ? Proof of income (1040, last year's tax return, W2, 4 current pay stubs, other income proof)  ? Proof of assets (current bank statement +  3 most recent, disability paperwork, life insurance info, tax value on autos, etc.)  3) Contact Your Local Health Department  Not all health departments have doctors that can see patients for sick visits, but many do, so it is worth a call  to see if yours does. If you don't know where your local health department is, you can check in your phone book. The CDC also has a tool to help you locate your state's health department, and many state websites also have listings of all of their local health departments.  4) Find a Walk-in Clinic  If your illness is not likely to be very severe or complicated, you may want to try a walk in clinic. These are popping up all over the country in pharmacies, drugstores, and shopping centers. They're usually staffed by nurse practitioners or physician assistants that have been trained to treat common illnesses and complaints. They're usually fairly quick and inexpensive. However, if you have serious medical issues or chronic medical problems, these are probably not your best option            Hypothyroidism  Hypothyroidism is when the thyroid gland does not make enough of certain hormones (it is underactive). The thyroid gland is a small gland located in the lower front part of the neck, just in front of the windpipe (trachea). This gland makes hormones that help control how the body uses food for energy (metabolism) as well as how the heart and brain function. These hormones also play a role in keeping your bones strong. When the thyroid is underactive, it produces too little of the hormones thyroxine (T4) and triiodothyronine (T3). What are the causes? This condition may be caused by:  Hashimoto's disease. This is a disease in which the body's disease-fighting system (immune system) attacks the thyroid gland. This is the most common cause.  Viral infections.  Pregnancy.  Certain medicines.  Birth defects.  Past radiation treatments to the head or neck for cancer.  Past treatment with radioactive iodine.  Past exposure to radiation in the environment.  Past surgical removal of part or all of the thyroid.  Problems with a gland in the center of the brain (pituitary gland).  Lack of  enough iodine in the diet. What increases the risk? You are more likely to develop this condition if:  You are female.  You have a family history of thyroid conditions.  You use a medicine called lithium.  You take medicines that affect the immune system (immunosuppressants). What are the signs or symptoms? Symptoms of this condition include:  Feeling as though you have no energy (lethargy).  Not being able to tolerate cold.  Weight gain that is not explained by a change in diet or exercise habits.  Lack of appetite.  Dry skin.  Coarse hair.  Menstrual irregularity.  Slowing of thought processes.  Constipation.  Sadness or depression. How is this diagnosed? This condition may be diagnosed based on:  Your symptoms, your medical history, and a physical exam.  Blood tests. You may also have imaging tests, such as an ultrasound or MRI. How is this treated? This condition is treated with medicine that replaces the thyroid hormones that your body does not make. After you begin treatment, it may take several weeks for symptoms to go away. Follow these instructions at home:  Take over-the-counter and prescription medicines only as told by your health care provider.  If you start taking any new medicines, tell your health care provider.  Keep all follow-up visits as told by your health care provider. This is important. ? As your condition improves, your dosage of thyroid hormone medicine may change. ? You will need to have blood tests regularly so that your health care provider can monitor your condition. Contact a health care provider if:  Your symptoms do not get better with treatment.  You are taking thyroid replacement medicine and you: ? Sweat a lot. ? Have tremors. ? Feel anxious. ? Lose weight rapidly. ? Cannot tolerate heat. ? Have emotional swings. ? Have diarrhea. ? Feel weak. Get help right away if you have:  Chest pain.  An irregular  heartbeat.  A rapid heartbeat.  Difficulty breathing. Summary  Hypothyroidism is when the thyroid gland does not make enough of certain hormones (it is underactive).  When the thyroid is underactive, it produces too little of the hormones thyroxine (T4) and triiodothyronine (T3).  The most common cause is Hashimoto's disease, a disease in which the body's disease-fighting system (immune system) attacks the thyroid gland. The condition can also be caused by viral infections, medicine, pregnancy, or past radiation treatment to the head or neck.  Symptoms may include weight gain, dry skin, constipation, feeling as though you do not have energy, and not being able to tolerate cold.  This condition is treated with medicine to replace the thyroid hormones that your body does not make. This information is not intended to replace advice given to you by your health care provider. Make sure you discuss any questions you have with your health care provider. Document Released: 11/28/2005 Document Revised: 11/08/2017 Document Reviewed: 11/08/2017 Elsevier Interactive Patient Education  2019 [employer].

## 2018-12-31 NOTE — Progress Notes (Signed)
Subjective:     Heather Galloway is a 33 y.o. female who presents for a postpartum visit. She is 6 weeks postpartum following a spontaneous vaginal delivery. I have fully reviewed the prenatal and intrapartum course. The delivery was at 56w6dgestational weeks. Outcome: spontaneous vaginal delivery. Anesthesia: Fentanyl. Postpartum course has been unremarkable . Baby's course has been unremarkable. Baby is feeding by breast. Bleeding staining only. Bowel function is normal. Bladder function is normal. Patient is not sexually active. Contraception method is none. Postpartum depression screening: negative.  The following portions of the patient's history were reviewed and updated as appropriate: allergies, current medications, past family history, past medical history, past social history, past surgical history and problem list.  Review of Systems Pertinent items are noted in HPI.   Objective:    BP 119/72   Pulse 64   Wt 147 lb (66.7 kg)   BMI 27.78 kg/m   General:  alert, cooperative and appears stated age  Lungs: clear to auscultation bilaterally  Heart:  regular rate and rhythm, S1, S2 normal, no murmur, click, rub or gallop  Abdomen: soft, non-tender; bowel sounds normal; no masses,  no organomegaly   Vulva:  not evaluated  Vagina: not evaluated        Assessment:     Normal postpartum exam. Pap smear not done at today's visit.   Plan:  1. Encounter for routine postpartum follow-up -doing well -pap up to date  2. Hypothyroidism, unspecified type -given information for self pay PCP - levothyroxine (SYNTHROID, LEVOTHROID) 100 MCG tablet; Take 1 tablet (100 mcg total) by mouth daily before breakfast.  Dispense: 30 tablet; Refill: 1 - TSH - T3, free - T4, free   LJorje Guild NP

## 2019-01-01 LAB — T4, FREE: Free T4: 1.7 ng/dL (ref 0.82–1.77)

## 2019-01-01 LAB — TSH: TSH: 0.034 u[IU]/mL — ABNORMAL LOW (ref 0.450–4.500)

## 2019-01-01 LAB — T3, FREE: T3, Free: 3.8 pg/mL (ref 2.0–4.4)

## 2019-01-28 ENCOUNTER — Other Ambulatory Visit: Payer: Self-pay | Admitting: Student

## 2019-01-28 DIAGNOSIS — E039 Hypothyroidism, unspecified: Secondary | ICD-10-CM

## 2019-09-02 ENCOUNTER — Other Ambulatory Visit: Payer: Self-pay | Admitting: *Deleted

## 2019-09-02 ENCOUNTER — Telehealth (INDEPENDENT_AMBULATORY_CARE_PROVIDER_SITE_OTHER): Payer: Medicaid Other | Admitting: *Deleted

## 2019-09-02 DIAGNOSIS — E039 Hypothyroidism, unspecified: Secondary | ICD-10-CM

## 2019-09-02 NOTE — Telephone Encounter (Signed)
Fax request received on 9/9 for refill of Levothyroxine  137 mg. Per chart review, pt received initial Rx from Jorje Guild, NP on 12/31/18 during post partum visit for #30 tabs and 1 refill. Per [facility], pt last filled Rx on 07/28/19. Message sent to [facility] for plan of care recommendation due to it appears pt has not taken medication regularly and may require follow up with PCP.

## 2019-09-02 NOTE — Progress Notes (Signed)
Opened in error

## 2019-09-12 NOTE — Telephone Encounter (Signed)
Called patient with pacific interpreter [phone] and discussed she needs a primary care provider who can manage her hypothyroidism and monitor her thyroid labs. Provided number and information to [facility]. Patient verbalized understanding & had no questions.

## 2020-01-03 ENCOUNTER — Encounter: Payer: Self-pay | Admitting: Family Medicine

## 2020-01-03 ENCOUNTER — Other Ambulatory Visit: Payer: Self-pay

## 2020-01-03 ENCOUNTER — Ambulatory Visit (INDEPENDENT_AMBULATORY_CARE_PROVIDER_SITE_OTHER): Payer: Self-pay | Admitting: Family Medicine

## 2020-01-03 VITALS — BP 104/76 | HR 74 | Ht 61.5 in | Wt 157.2 lb

## 2020-01-03 DIAGNOSIS — E039 Hypothyroidism, unspecified: Secondary | ICD-10-CM

## 2020-01-03 NOTE — Progress Notes (Signed)
   Subjective:    Patient ID: Heather Galloway, female    DOB: 11-06-1986, 34 y.o.   MRN: [id number]   CC: Heather Galloway is a 34 yr old female who presents today for a new patient visit   HPI:  Current concerns include:   Hx of Hypothyroidism  Pt was dx with hypothyroidism in 2013 where TSH was abnormal. She treated with Synthroid initially at 280mg in 2013, then reduced to 75m, then 15029m then 137m86muring pregnancy. After the birth of her last child in Dec 2019 was recommended to take 100mc4montinued at this dose of Sythroid until 3 months ago where she ran out of her meds. Does not have insurance. Denies cold intolerance, unexplained weight gain, ammenorhea, constipation, edema . Feels very tired since the birth of her second child.  PMH Hypothyroidism  PSH None  DH nkda Synthroid   FH none  SH Lives with2  Children age 62.5 and 13 mo23 monthshusband. [id number]. Denies smoking, drinking ETOH or illicit drug use Smoking status reviewed   ROS: pertinent noted in the HPI    Past medical history, surgical, family, and social history reviewed and updated in the EMR as appropriate. Reviewed problem list.   Objective:  BP 104/76   Pulse 74   Ht 5' 1.5" (1.562 m)   Wt 157 lb 3.2 oz (71.3 kg)   LMP 12/24/2019   SpO2 97%   BMI 29.22 kg/m   Vitals and nursing note reviewed  General: NAD, pleasant, able to participate in exam Cardiac: RRR, S1 S2 present. normal heart sounds, no murmurs. Respiratory: CTAB, normal effort, No wheezes, rales or rhonchi Extremities: no edema or cyanosis. Skin: warm and dry, no rashes noted Neuro: alert, no obvious focal deficits Psych: Normal affect and mood  PHQ0  Assessment & Plan:    Hypothyroidism Likely poorly controlled hypothyroidism given pt has not taken Synthroid for 3 months. Will test TSH today and restart Synthroid after level is back. Given pt information on how to apply for the [facility].   PoonaLattie Haw   [facility] [place]

## 2020-01-03 NOTE — Patient Instructions (Signed)
Hi Leba,  It was lovely to meet you today! Today we checked your thyroid levels today, I will make call you with the results today or next Monday. We can then start the synthroid. We have given you info on getting an orange card which helps you pay for healthcare.  I look forward to seeing you again!  Best wises,  Ulani Degrasse

## 2020-01-04 LAB — TSH: TSH: 10.3 u[IU]/mL — ABNORMAL HIGH (ref 0.450–4.500)

## 2020-01-05 NOTE — Assessment & Plan Note (Signed)
Likely poorly controlled hypothyroidism given pt has not taken Synthroid for 3 months. Will test TSH today and restart Synthroid after level is back. Given pt information on how to apply for the [facility] card.

## 2020-01-06 ENCOUNTER — Other Ambulatory Visit: Payer: Self-pay | Admitting: Family Medicine

## 2020-01-06 ENCOUNTER — Telehealth: Payer: Self-pay | Admitting: Family Medicine

## 2020-01-06 DIAGNOSIS — E039 Hypothyroidism, unspecified: Secondary | ICD-10-CM

## 2020-01-06 MED ORDER — LEVOTHYROXINE SODIUM 100 MCG PO TABS: 100.0000 ug | ORAL_TABLET | Freq: Every day | ORAL | 1 refills | Status: DC

## 2020-01-06 NOTE — Telephone Encounter (Signed)
Called patient and spoke to her husband regarding her recent lab work. Pt had given me verbal consent to discuss lab results with him during appointment last week. Explained TSH is 10 and needs immediate treatment with Synthroid. I have sent Synthroid to their pharmacy. Recommended f/u in 1 month to check TSH level.

## 2020-01-30 ENCOUNTER — Other Ambulatory Visit: Payer: Self-pay | Admitting: Family Medicine

## 2020-01-30 DIAGNOSIS — E039 Hypothyroidism, unspecified: Secondary | ICD-10-CM

## 2020-02-12 ENCOUNTER — Other Ambulatory Visit: Payer: Self-pay

## 2020-02-12 ENCOUNTER — Ambulatory Visit (INDEPENDENT_AMBULATORY_CARE_PROVIDER_SITE_OTHER): Payer: Self-pay | Admitting: Family Medicine

## 2020-02-12 VITALS — BP 110/60 | HR 69 | Wt 159.0 lb

## 2020-02-12 DIAGNOSIS — F39 Unspecified mood [affective] disorder: Secondary | ICD-10-CM

## 2020-02-12 DIAGNOSIS — R5383 Other fatigue: Secondary | ICD-10-CM

## 2020-02-12 DIAGNOSIS — E039 Hypothyroidism, unspecified: Secondary | ICD-10-CM

## 2020-02-12 NOTE — Patient Instructions (Signed)
Heather Galloway,  Thank you for coming to see me today, I am sorry that you have been very tired and feeling low.  I am checking some labs today to check your thyroid function, kidney function and blood levels to ensure they are not low.  I will call you and your husband with the results. Unfortunately our clinic does not offer psychology services in [place] but I have provided you with some psychologist in the area who speak [place].  Please contact them and you may be able to find somebody that you are able to speak to. You may have to pay out-of-pocket for this.  Please follow-up with me for a thyroid blood test in a few months time.  Best wishes and take care,  [name]

## 2020-02-12 NOTE — Progress Notes (Signed)
    SUBJECTIVE:   CHIEF COMPLAINT / HPI:   A virtual [place] speaking interpreter was used for the entirety of this encounter.   Hypothyroidism Patient would like a refill for her Levothyroxine.  Has been taking 100 mcg Levothyroxine since January.  Has not felt a difference in her symptoms since restarting the Levothyroxine. Has felt very tired she was expressed at the last visit. See below for further details. Would like make sure that she has enough medication to last her until August and would like her TSH levels to be checked every 6 months.    Fatigue Patient reports feeling very tired and weak. She has 2 children at home, her 61-month-old and keeps her very busy and she finds it exhausting looking after her. Last night her son went to bed at 2 AM and woke up at 7 AM meaning she did not get much rest. Currently on her menstrual cycle. Reports cycles are heavy and painful and that this is new change, they never used to be like this. She changes her pad every 2-3 hours and also uses paper towels. Reports clots.  Low mood Feels low in mood because she is new to living in [place] and does not speak the language and the culture is different. She does not know many people who speak [place] and feels isolated.  Denies SI, thoughts of self-harm or harm to her children. Denies anhedonia or anorexia. Would like to see a psychologist who speaks [place] and does not want to use an interpretor. She does not wish to start antidepressant today.  PERTINENT  PMH / PSH: Hypothyroidism  OBJECTIVE:   BP 110/60   Pulse 69   Wt 159 lb (72.1 kg)   SpO2 98%   BMI 29.56 kg/m    General: Alert and cooperative and appears to be in no acute distress Cardio: Normal S1 and S2, RRR, No murmurs or rubs.   Pulm: CTAB, no crackles, wheezing, or diminished breath sounds. Normal  WOB Abdomen: Bowel sounds normal. Abdomen soft and non-tender.  Extremities: No peripheral edema. Warm/ well perfused.   Neuro: Cranial  nerves grossly intact  ASSESSMENT/PLAN:   Fatigue Fatigue likely multifactorial: due to being mother of 2 young children, menorrhagia and low mood. TSH today 1.3 (improved from 10 in January) so hypothyroidism unlikely cause of fatigue.  -CBC  -Iron panel  -BMP   Hypothyroidism TSH today 1.3 (improved from 10 at last visit) and wnl. -Continue Levothyroxine -Repeat TSH in 2-3 months and titrate accordingly.  Mild mood disorder (HCC) PHQ 9: 5 today. Denies SI, thoughts of self harm or harm to others. Does not want to start antidepressant. Would rather have therapy with [place] speaking therapist and thinks she would be very helpful for her. Provided details of [place] speaking therapists from [email or web address] Reassured patient that I am here to speak to her/refer her to our psychologists at [facility] if she is unable to find a [place] therapist. Pt understood and was grateful.     Towanda Octave, MD [facility]

## 2020-02-13 LAB — CBC
Hematocrit: 37.6 % (ref 34.0–46.6)
Hemoglobin: 12.8 g/dL (ref 11.1–15.9)
MCH: 30.4 pg (ref 26.6–33.0)
MCHC: 34 g/dL (ref 31.5–35.7)
MCV: 89 fL (ref 79–97)
Platelets: 355 10*3/uL (ref 150–450)
RBC: 4.21 x10E6/uL (ref 3.77–5.28)
RDW: 11.7 % (ref 11.7–15.4)
WBC: 9.1 10*3/uL (ref 3.4–10.8)

## 2020-02-13 LAB — IRON,TIBC AND FERRITIN PANEL
Ferritin: 33 ng/mL (ref 15–150)
Iron Saturation: 24 % (ref 15–55)
Iron: 68 ug/dL (ref 27–159)
Total Iron Binding Capacity: 285 ug/dL (ref 250–450)
UIBC: 217 ug/dL (ref 131–425)

## 2020-02-13 LAB — TSH: TSH: 1.3 u[IU]/mL (ref 0.450–4.500)

## 2020-02-14 ENCOUNTER — Telehealth: Payer: Self-pay | Admitting: Family Medicine

## 2020-02-14 NOTE — Telephone Encounter (Signed)
Contacted patient's husband to inform him that Orlean's labs were normal. She should continue the same dose of Levothyroxine. Follow up with me in June per patient's request. Husband will inform [place] about lab results.

## 2020-02-15 DIAGNOSIS — R5383 Other fatigue: Secondary | ICD-10-CM | POA: Insufficient documentation

## 2020-02-15 DIAGNOSIS — F39 Unspecified mood [affective] disorder: Secondary | ICD-10-CM | POA: Insufficient documentation

## 2020-02-15 NOTE — Assessment & Plan Note (Signed)
Fatigue likely multifactorial: due to being mother of 2 young children, menorrhagia and low mood. TSH today 1.3 (improved from 10 in January) so hypothyroidism unlikely cause of fatigue.  -CBC  -Iron panel  -BMP

## 2020-02-15 NOTE — Assessment & Plan Note (Signed)
PHQ 9: 5 today. Denies SI, thoughts of self harm or harm to others. Does not want to start antidepressant. Would rather have therapy with [place] speaking therapist and thinks she would be very helpful for her. Provided details of [place] speaking therapists from [email or web address] Reassured patient that I am here to speak to her/refer her to our psychologists at [facility] if she is unable to find a [place] therapist. Pt understood and was grateful.

## 2020-02-15 NOTE — Assessment & Plan Note (Signed)
TSH today 1.3 (improved from 10 at last visit) and wnl. -Continue Levothyroxine -Repeat TSH in 2-3 months and titrate accordingly.

## 2020-03-29 ENCOUNTER — Other Ambulatory Visit: Payer: Self-pay | Admitting: Family Medicine

## 2020-03-29 DIAGNOSIS — E039 Hypothyroidism, unspecified: Secondary | ICD-10-CM

## 2020-05-25 ENCOUNTER — Other Ambulatory Visit: Payer: Self-pay | Admitting: Family Medicine

## 2020-05-25 DIAGNOSIS — E039 Hypothyroidism, unspecified: Secondary | ICD-10-CM

## 2020-07-26 IMAGING — US US MFM OB FOLLOW-UP
1 series · 13 of 28 positions shown · non-contrast
Comparison: none

[Series 1: us mfm ob follow-up · 13 of 43 slices shown]
[im 2/43]
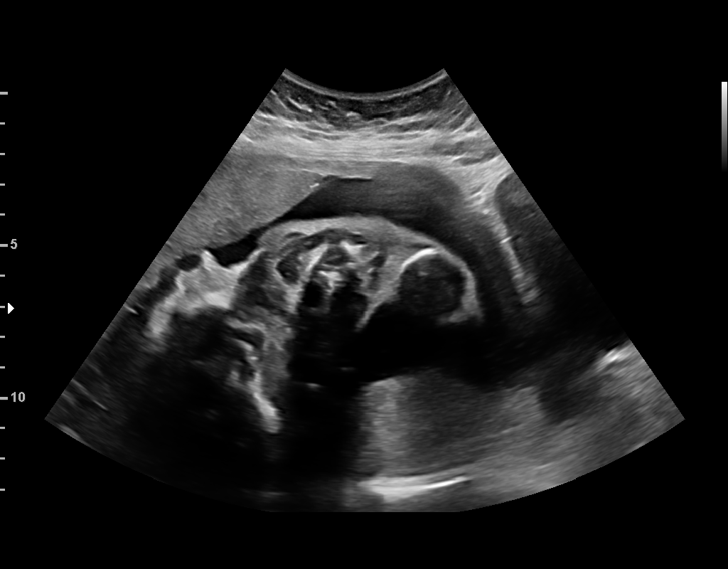
[im 5/43]
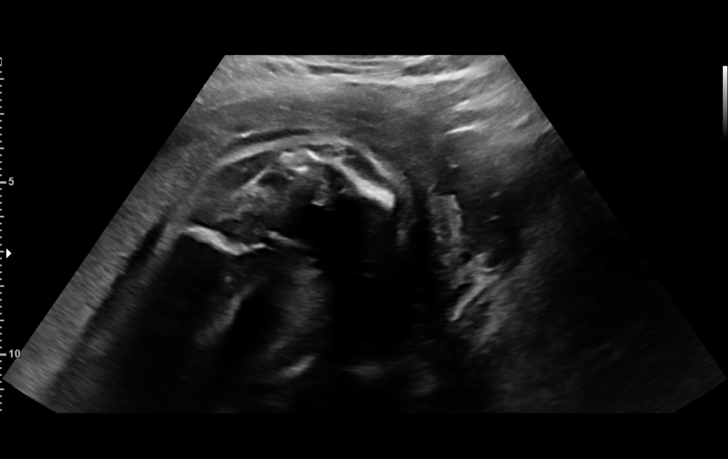
[im 8/43]
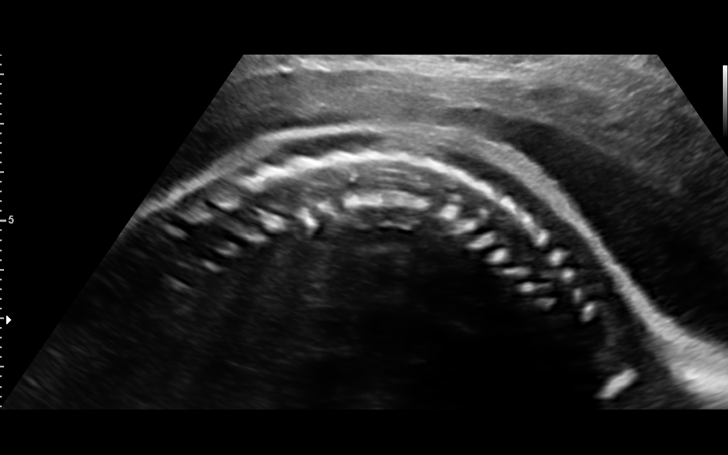
[im 11/43]
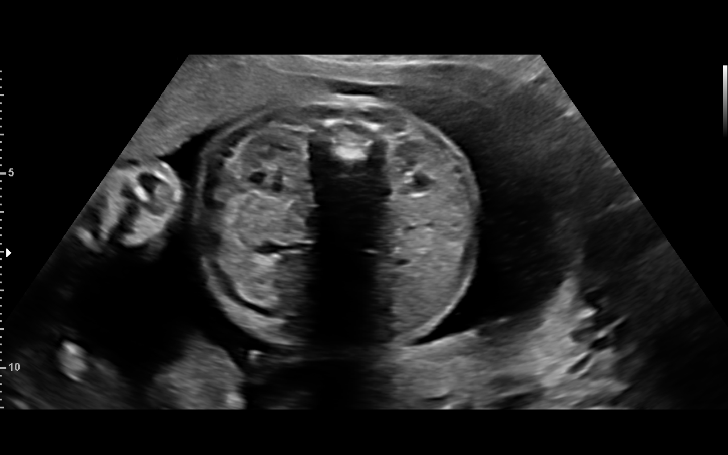
[im 15/43]
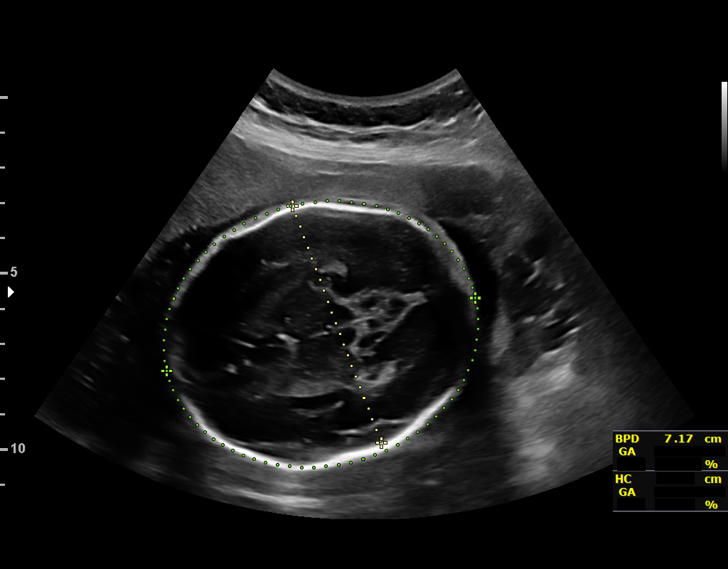
[im 18/43]
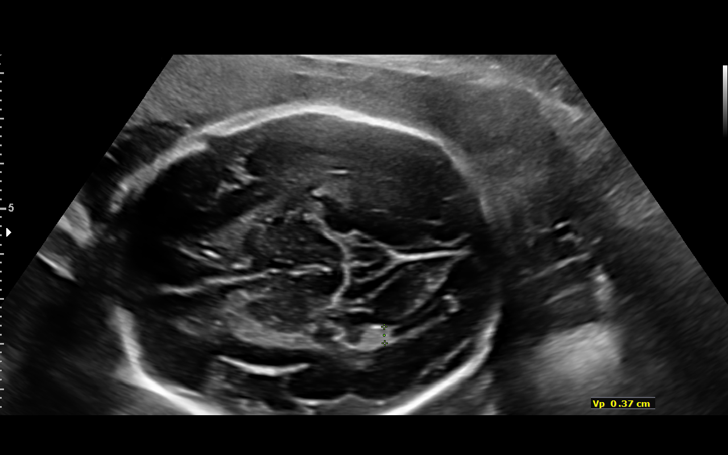
[im 22/43]
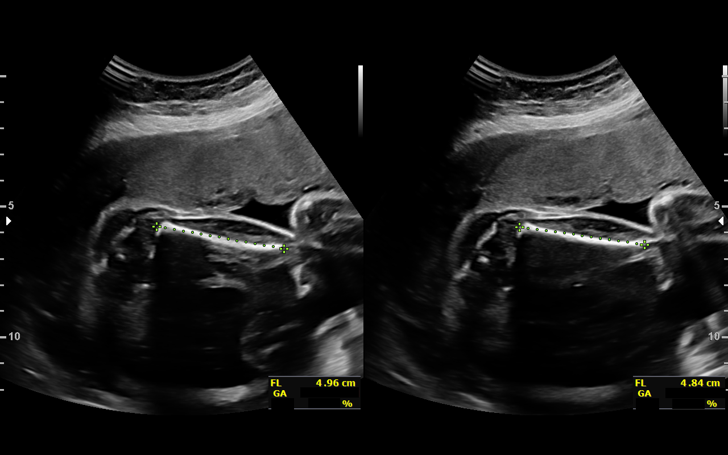
[im 25/43]
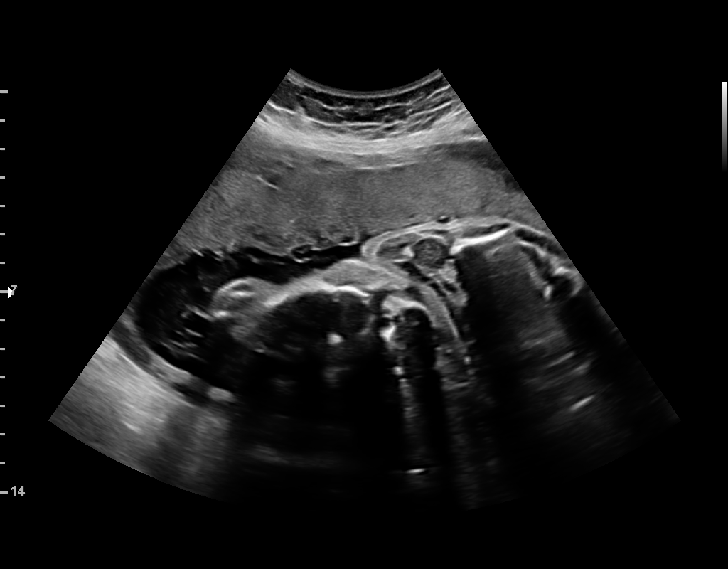
[im 29/43]
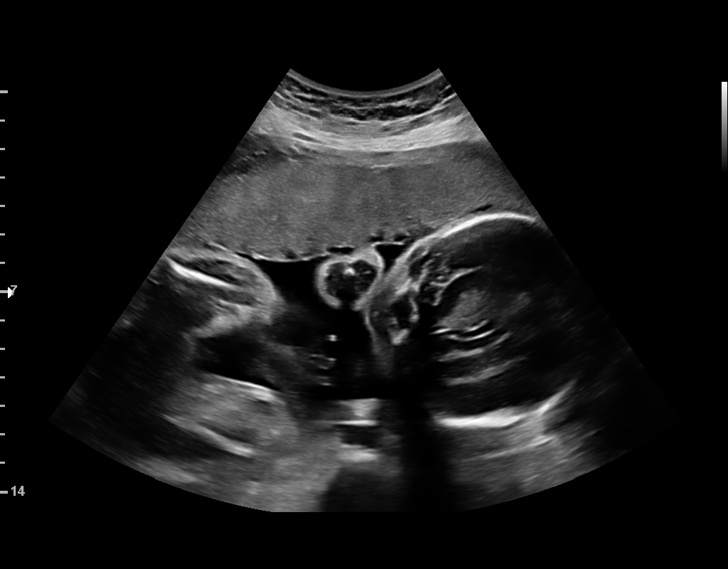
[im 32/43]
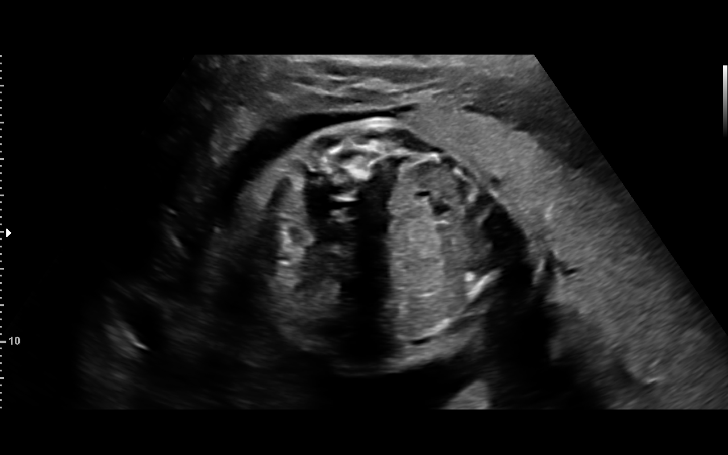
[im 35/43]
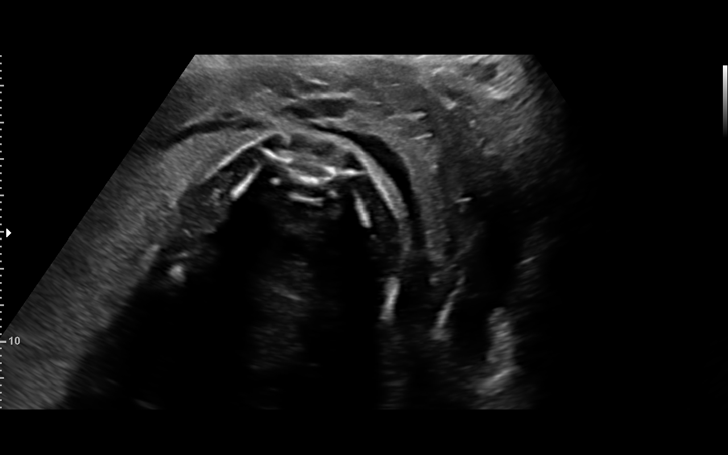
[im 38/43]
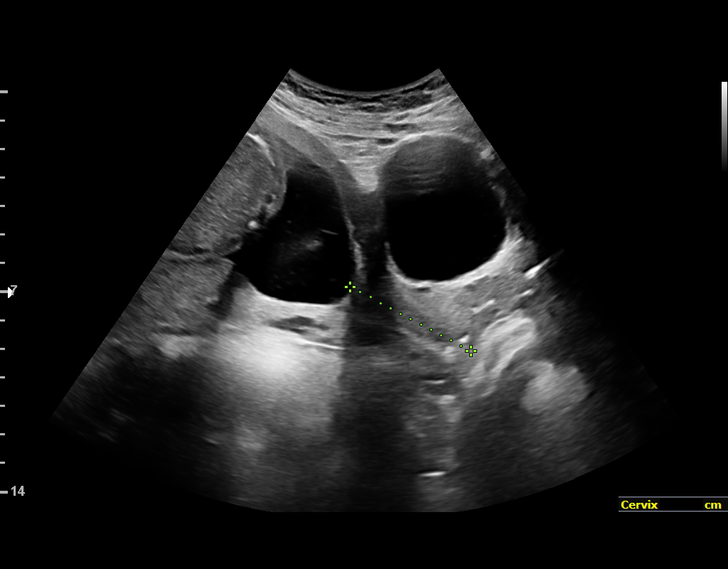
[im 41/43]
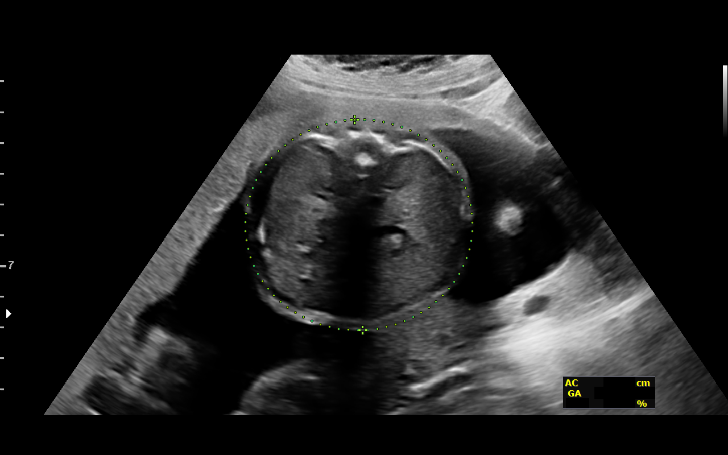

[13 of 28 positions shown; findings below may reference images not displayed]

Indications

25 weeks gestation of pregnancy
Hypothyroid (controlled well)
Encounter for other antenatal screening
follow-up
Vital Signs

BMI:         29.85        Pulse:  86
BP:          103/61
Fetal Evaluation

Num Of Fetuses:         1
Fetal Heart Rate(bpm):  125
Cardiac Activity:       Observed
Presentation:           Cephalic
Placenta:               Anterior
P. Cord Insertion:      Previously Visualized

Amniotic Fluid
AFI FV:      Within normal limits

Largest Pocket(cm)
6.26
Biometry

BPD:        71  mm     G. Age:  28w 4d         98  %    CI:        76.13   %    70 - 86
FL/HC:      19.1   %    18.6 -
HC:      257.9  mm     G. Age:  28w 0d         91  %    HC/AC:      1.15        1.04 -
AC:      223.8  mm     G. Age:  26w 5d         70  %    FL/BPD:     69.4   %    71 - 87
FL:       49.3  mm     G. Age:  26w 4d         59  %    FL/AC:      22.0   %    20 - 24
HUM:      45.7  mm     G. Age:  27w 0d         72  %
LV:        3.7  mm
Est. FW:    3449  gm      2 lb 3 oz     75  %
OB History

Gravidity:    2         Term:   1
Living:       1
Gestational Age

LMP:           25w 6d        Date:  03/08/18                 EDD:   12/13/18
U/S Today:     27w 3d                                        EDD:   12/02/18
Best:          25w 6d     Det. By:  LMP  (03/08/18)          EDD:   12/13/18
Anatomy

Cranium:               Appears normal         Aortic Arch:            Previously seen
Cavum:                 Previously seen        Ductal Arch:            Previously seen
Ventricles:            Appears normal         Diaphragm:              Previously seen
Choroid Plexus:        Previously seen        Stomach:                Appears normal, left
sided
Cerebellum:            Previously seen        Abdomen:                Previously seen
Posterior Fossa:       Previously seen        Abdominal Wall:         Previously seen
Nuchal Fold:           Not applicable (>20    Cord Vessels:           Previously seen
wks GA)
Face:                  Orbits and profile     Kidneys:                Appear normal
previously seen
Lips:                  Previously seen        Bladder:                Appears normal
Thoracic:              Appears normal         Spine:                  Appears normal
Heart:                 Previously seen        Upper Extremities:      Previously seen
RVOT:                  Previously seen        Lower Extremities:      Previously seen
LVOT:                  Previously seen

Other:  Fetus appears to be a male. Heels prev visualized. Nasal bone prev
visualized. Technically difficult due to fetal position.
Cervix Uterus Adnexa

Cervix
Length:           4.77  cm.
Normal appearance by transabdominal scan.
Comments

U/S images reviewed. Findings reviewed with patient.
Appropriate fetal growth is noted.  No fetal abnormalities are
seen.
Questions answered.
10 minutes spent face to face with patient.
Recommendations: 1) Follow-up growth in 6 weeks
Recommendations

1) Follow-up growth in 6 weeks

## 2020-10-11 IMAGING — US US MFM FETAL BPP W/O NON-STRESS
1 series · 15 of 15 positions shown · non-contrast
Comparison: none

[Series 1: us mfm fetal bpp w/o non-stress · 15 acquisitions, 15 frames shown]
[im 1/15]
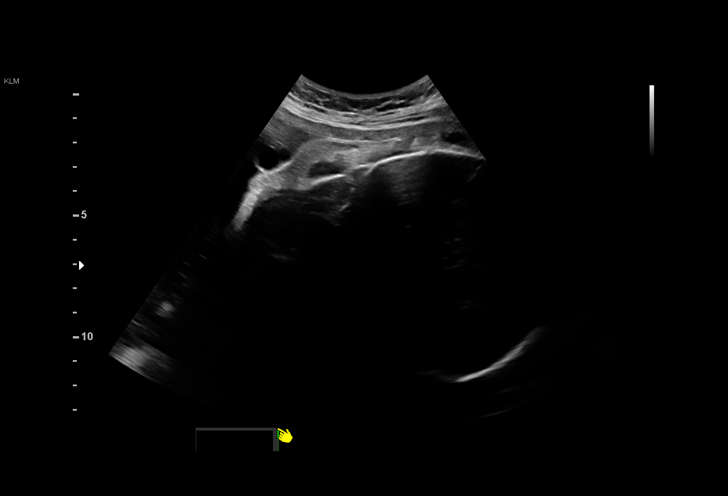
[im 2/15]
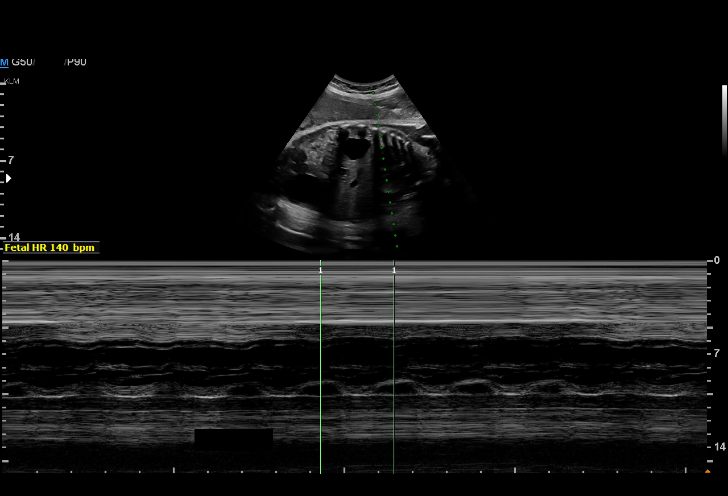
[im 3/15]
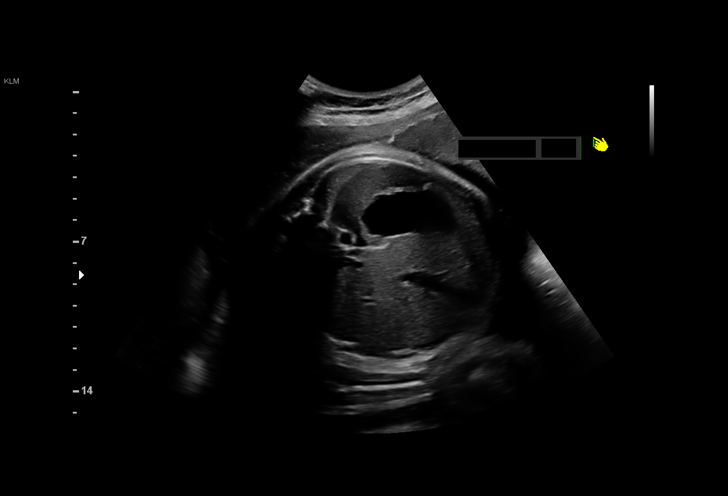
[im 4/15]
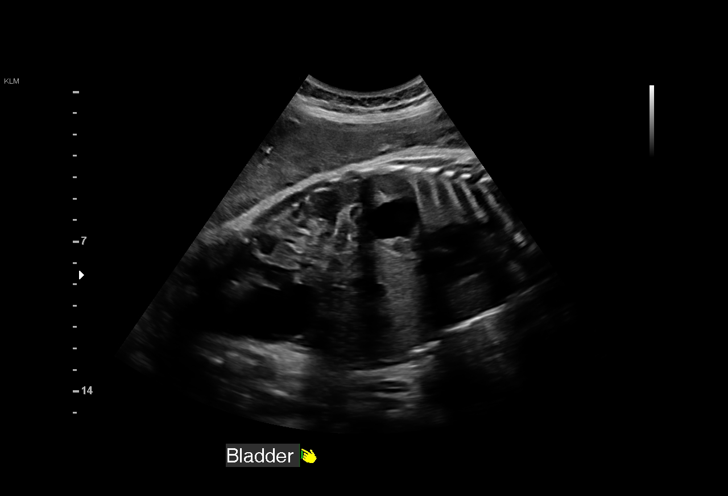
[im 5/15]
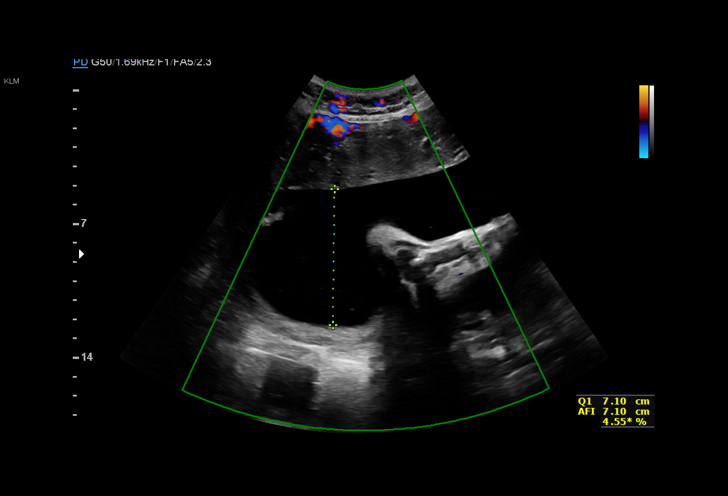
[im 6/15]
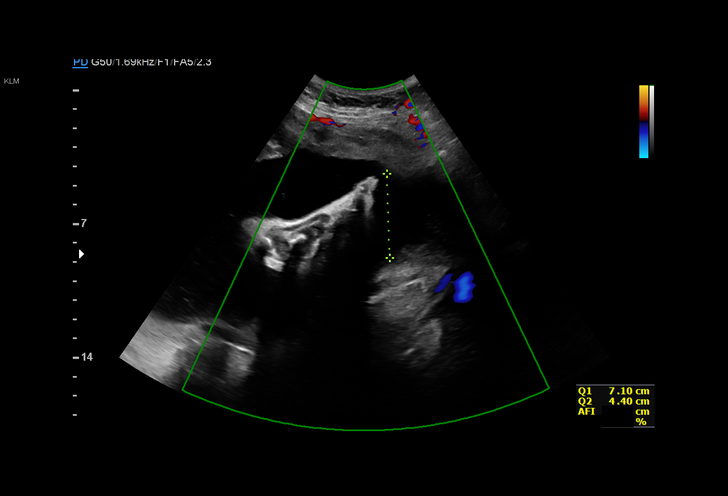
[im 7/15]
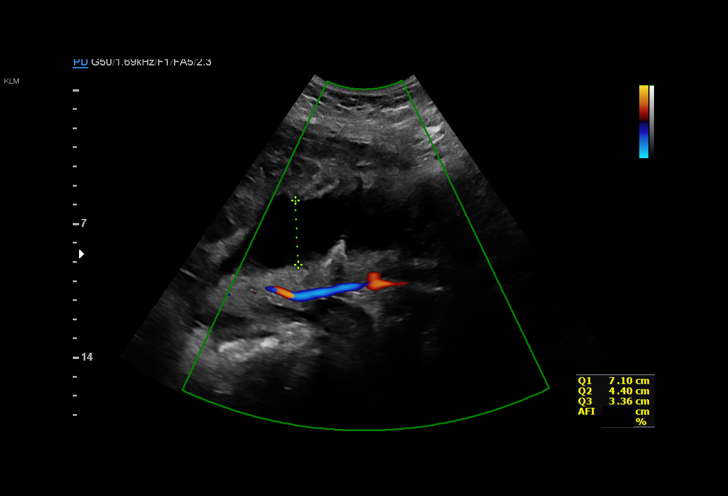
[im 8/15]
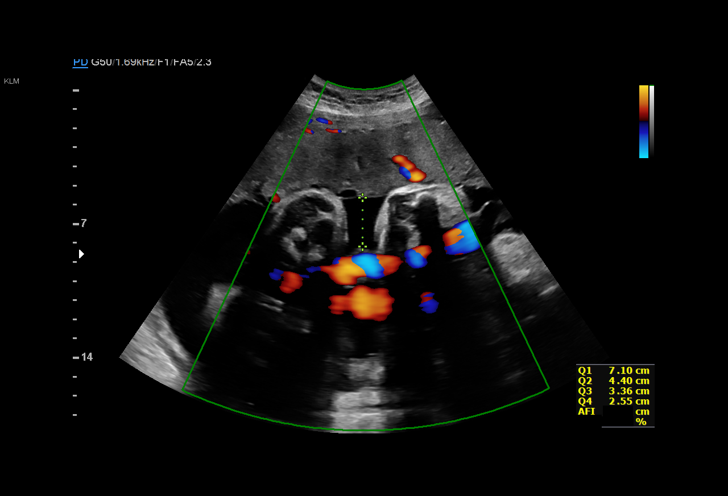
[im 9/15]
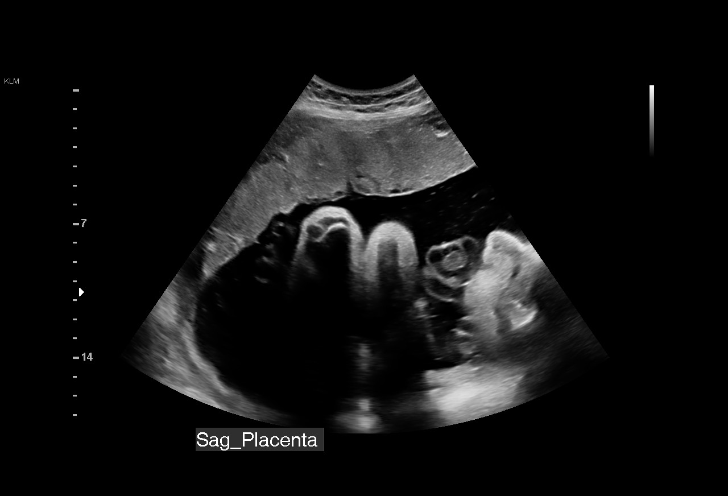
[im 10/15]
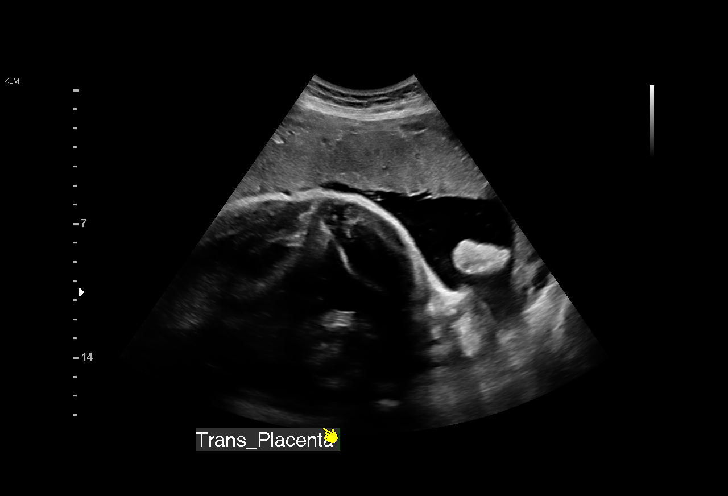
[im 11/15]
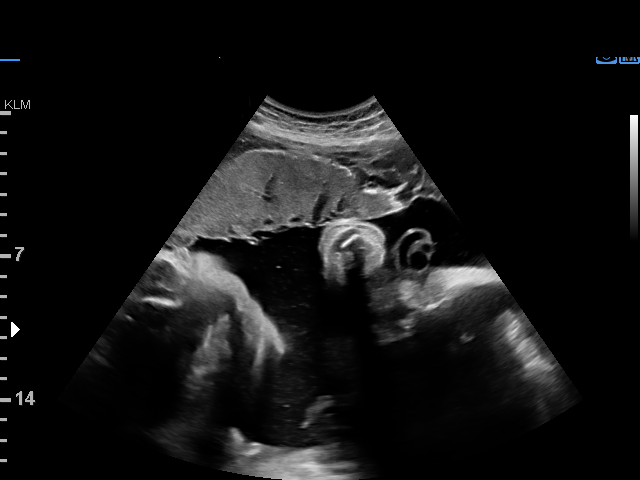
[im 12/15]
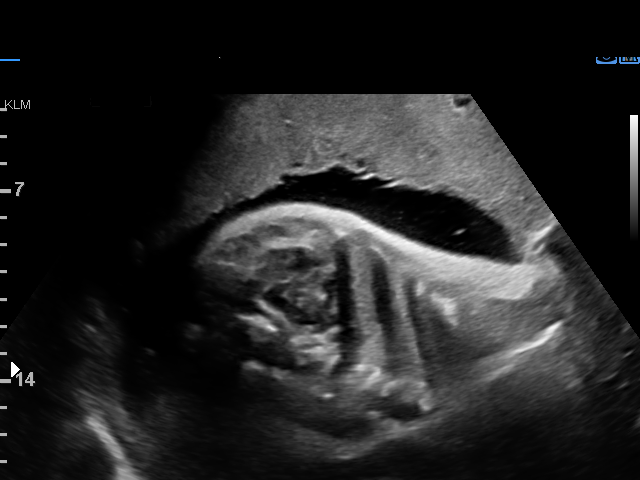
[im 13/15]
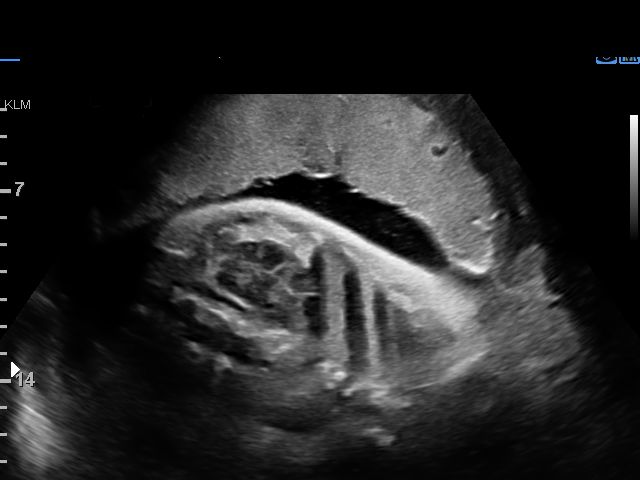
[im 14/15]
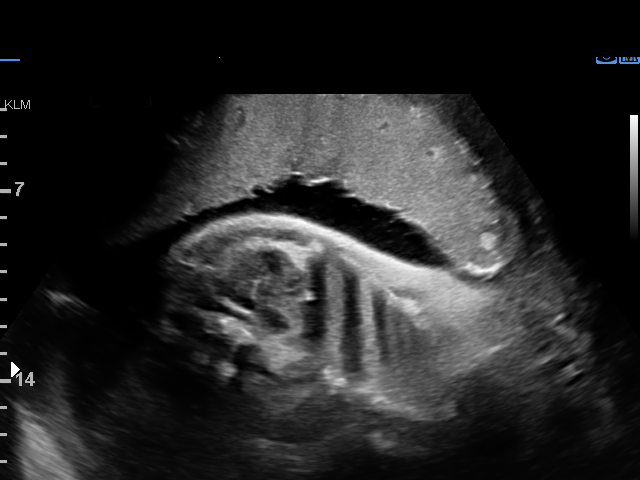
[im 15/15]
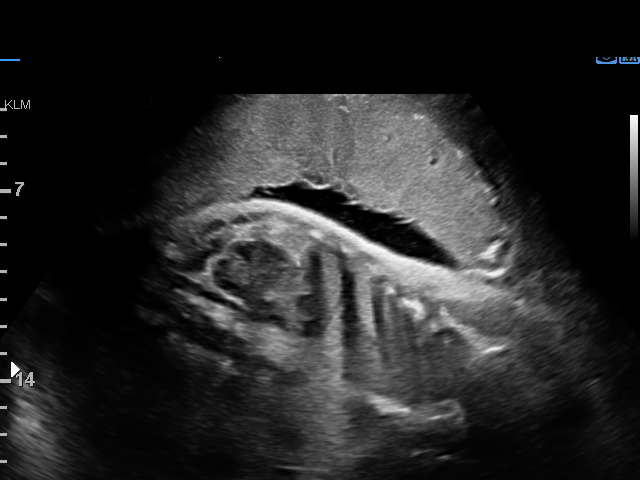

[15 of 15 positions shown; findings below may reference images not displayed]

CNM

     STRESS                                            SANVELE
 ----------------------------------------------------------------------

 ----------------------------------------------------------------------
Indications

  Polyhydramnios, third trimester, antepartum
  condition or complication, unspecified fetus
  38 weeks gestation of pregnancy
  Hypothyroid (controlled well)
 ----------------------------------------------------------------------
Vital Signs

                                                Height:        5'1"
Fetal Evaluation

 Num Of Fetuses:         1
 Fetal Heart Rate(bpm):  140
 Cardiac Activity:       Observed
 Presentation:           Cephalic
 Placenta:               Anterior

 Amniotic Fluid
 AFI FV:      Within normal limits

 AFI Sum(cm)     %Tile       Largest Pocket(cm)
 17.41           69

 RUQ(cm)       RLQ(cm)       LUQ(cm)        LLQ(cm)


 Comment:    [DATE] BPP in 13 minutes.
Biophysical Evaluation

 Amniotic F.V:   Within normal limits       F. Tone:        Observed
 F. Movement:    Observed                   Score:          [DATE]
 F. Breathing:   Observed
OB History

 Gravidity:    2         Term:   1
 Living:       1
Gestational Age

 LMP:           36w 6d        Date:  03/08/18                 EDD:   12/13/18
 Best:          38w 3d     Det. By:  Previous Ultrasound      EDD:   12/02/18
                                     (07/25/18)
Anatomy

 Stomach:               Appears normal, left   Bladder:                Appears normal
                        sided
Comments

 U/S images reviewed.  No evidence of fetal compromise is
 found on BPP today.  No fetal abnormalities are seen.
  Recommendations: 1) Serial U/S every 4 weeks for fetal
 growth 2) Weekly BPP
Recommendations

 1) Serial U/S every 4 weeks for fetal growth 2) Weekly BPP
              [place], Iglesia

## 2020-10-15 ENCOUNTER — Encounter: Payer: Self-pay | Admitting: *Deleted

## 2021-05-31 ENCOUNTER — Other Ambulatory Visit: Payer: Self-pay | Admitting: Family Medicine

## 2021-05-31 DIAGNOSIS — E039 Hypothyroidism, unspecified: Secondary | ICD-10-CM

## 2022-06-03 ENCOUNTER — Other Ambulatory Visit: Payer: Self-pay | Admitting: Family Medicine

## 2022-06-03 DIAGNOSIS — E039 Hypothyroidism, unspecified: Secondary | ICD-10-CM

## 2023-02-02 ENCOUNTER — Ambulatory Visit (INDEPENDENT_AMBULATORY_CARE_PROVIDER_SITE_OTHER): Payer: Self-pay | Admitting: Family Medicine

## 2023-02-02 VITALS — BP 106/68 | HR 72 | Ht 61.5 in | Wt 164.2 lb

## 2023-02-02 DIAGNOSIS — R635 Abnormal weight gain: Secondary | ICD-10-CM

## 2023-02-02 DIAGNOSIS — Z13220 Encounter for screening for lipoid disorders: Secondary | ICD-10-CM

## 2023-02-02 DIAGNOSIS — E039 Hypothyroidism, unspecified: Secondary | ICD-10-CM

## 2023-02-02 NOTE — Progress Notes (Signed)
    SUBJECTIVE:   Chief compliant/HPI: annual examination  Heather Galloway is a 37 y.o. 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 She just obtained citizenship. She has no concerns.   Hypothyroidism Has been taking 100 mcg synthroid as prescribed. Would like rechecked levels.  OBJECTIVE:   BP 106/68   Pulse 72   Ht 5' 1.5" (1.562 m)   Wt 164 lb 3.2 oz (74.5 kg)   LMP 01/23/2023   SpO2 99%   BMI 30.52 kg/m   General: Alert and oriented, in NAD Skin: Warm, dry, and intact HEENT: NCAT, EOM grossly normal, midline nasal septum Cardiac: RRR, no m/r/g appreciated Respiratory: CTAB, breathing and speaking comfortably on RA Abdominal: Soft, nontender, nondistended, normoactive bowel sounds Extremities: Moves all extremities grossly equally Neurological: No gross focal deficit Psychiatric: Appropriate mood and affect, verbal PHQ-2 = 0  ASSESSMENT/PLAN:   Hypothyroidism Recheck TSH today since last recheck in 2021. Continue synthroid dose for now.   Annual Examination  PHQ-2 score 0, reviewed and discussed. Blood pressure reviewed and at goal.  Asked about intimate partner violence and patient reports she feels safe in relationships with 2 sons and husband.  The patient currently uses family planning for contraception. Folate recommended as appropriate, minimum of 400 mcg per day - patient declined.   Considered the following items based upon USPSTF recommendations: HIV testing:  previously obtained, normal Hepatitis C: discussed not ordered given low risk and financial constraints Hepatitis B: not ordered given low risk and financial constraints Syphilis if at high risk:  not high risk GC/CT not at high risk and not ordered. Lipid panel (nonfasting) discussed based upon AHA recommendations and ordered given age and BMI.  Consider repeat every 4-6 years. A1c unfortunately unable to be collected; can consider at future  visit. Reviewed risk factors for latent tuberculosis and  was tested in 2018 after immigrating to [place], reports results normal.  Discussed family history, BRCA testing  not ordered . Tool used to risk stratify was Pedigree Assessment tool: no family history of BRCA.  Cervical cancer screening: discussed and declined.  She would like to only have labs today due to financial constraints. She prefers a female physician to do this. Immunizations: declined flu and COVID   Follow up in 1 year or sooner if needed.  Ethelene Hal, MD [place]

## 2023-02-02 NOTE — Assessment & Plan Note (Signed)
Recheck TSH today since last recheck in 2021. Continue synthroid dose for now.

## 2023-02-02 NOTE — Patient Instructions (Addendum)
Bugn seni grmek harikayd?! ??te konu?tuklar?m?z:  1. Bugn laboratuvarlar? ald?k. Sonular? takip edece?im. 2. Do?um kontroln daha fazla tart??mak isterseniz bana bildirin. 3. Rahim a?z? kanseri taramas? iin smear testi yapt?rman?z gerekiyor. Bunun iin ltfen diledi?iniz zaman bir kad?n hekimden randevu al?n?z.  Ba?ka sorular?n?z varsa ltfen bana bildirin.  Dr.Ramiyah Mcclenahan  --------------------------  It was great to see you today! Here's what we talked about:  We obtained labs today. I will follow up results. Let me know should you want to discuss contraception further. You are due for a pap smear to screen for cervical cancer. Please make an appointment with a female physician for this when you would like.  Please let me know if you have any other questions.  Dr. Marcha Dutton

## 2023-02-03 ENCOUNTER — Encounter: Payer: Self-pay | Admitting: Family Medicine

## 2023-02-03 ENCOUNTER — Telehealth: Payer: Self-pay | Admitting: Family Medicine

## 2023-02-03 DIAGNOSIS — E039 Hypothyroidism, unspecified: Secondary | ICD-10-CM

## 2023-02-03 LAB — LIPID PANEL
Chol/HDL Ratio: 3.3 ratio (ref 0.0–4.4)
Cholesterol, Total: 191 mg/dL (ref 100–199)
HDL: 58 mg/dL (ref >39)
LDL Chol Calc (NIH): 123 mg/dL — ABNORMAL HIGH (ref 0–99)
Triglycerides: 52 mg/dL (ref 0–149)
VLDL Cholesterol Cal: 10 mg/dL (ref 5–40)

## 2023-02-03 LAB — T4F: T4,Free (Direct): 1.8 ng/dL — ABNORMAL HIGH (ref 0.82–1.77)

## 2023-02-03 LAB — TSH RFX ON ABNORMAL TO FREE T4: TSH: 0.047 u[IU]/mL — ABNORMAL LOW (ref 0.450–4.500)

## 2023-02-03 MED ORDER — LEVOTHYROXINE SODIUM 88 MCG PO TABS: 88.0000 ug | ORAL_TABLET | ORAL | 3 refills | Status: DC

## 2023-02-03 NOTE — Telephone Encounter (Signed)
Sent letter to patient (at her preference) discussing lab results and need to lower levothyroxine dose. Will send in levothyroxine 88 mcg daily. Advised patient to stop 100 mcg dose. Will need recheck in 6-8 weeks and titrate accordingly.

## 2023-02-14 ENCOUNTER — Telehealth: Payer: Self-pay | Admitting: Student

## 2023-02-14 DIAGNOSIS — Z789 Other specified health status: Secondary | ICD-10-CM

## 2023-02-14 DIAGNOSIS — Z1159 Encounter for screening for other viral diseases: Secondary | ICD-10-CM

## 2023-02-14 NOTE — Telephone Encounter (Signed)
Patient dropped off health exam certification to be completed. Last DOS was 02/02/23. Placed in [employer]

## 2023-02-15 NOTE — Telephone Encounter (Signed)
Clinical info completed on Work Exam Cert form.  Placed form in Dr Philbert Riser box for completion.    When form is completed, please route note to "RN Team" and place in wall pocket in front office.   Ottis Stain, CMA

## 2023-02-16 NOTE — Telephone Encounter (Signed)
Called and spoke with patient's husband. Patient scheduled for PPD placement tomorrow afternoon.   Please place future orders for Hep B titer, MMR and varicella immunity titers. Orders pended to this encounter.   Talbot Grumbling, RN

## 2023-02-17 ENCOUNTER — Ambulatory Visit (INDEPENDENT_AMBULATORY_CARE_PROVIDER_SITE_OTHER): Payer: Self-pay

## 2023-02-17 DIAGNOSIS — Z789 Other specified health status: Secondary | ICD-10-CM

## 2023-02-17 DIAGNOSIS — Z111 Encounter for screening for respiratory tuberculosis: Secondary | ICD-10-CM

## 2023-02-17 NOTE — Progress Notes (Signed)
Patient is here for a PPD placement.  PPD placed in left forearm @ 2:30 pm.  Patient will return 02/20/2023 to have PPD read.   Talbot Grumbling, RN

## 2023-02-17 NOTE — Telephone Encounter (Signed)
Orders signed.

## 2023-02-18 LAB — MEASLES/MUMPS/RUBELLA IMMUNITY
MUMPS ABS, IGG: 265 AU/mL (ref >10.9)
RUBEOLA AB, IGG: >300 AU/mL (ref >16.4)
Rubella Antibodies, IGG: 4.71 index (ref >0.99)

## 2023-02-18 LAB — HEPATITIS B SURFACE ANTIBODY, QUANTITATIVE: Hepatitis B Surf Ab Quant: 135.2 m[IU]/mL (ref >9.9)

## 2023-02-18 LAB — VARICELLA ZOSTER ANTIBODY, IGG: Varicella zoster IgG: 455 index (ref >165)

## 2023-02-20 ENCOUNTER — Ambulatory Visit (INDEPENDENT_AMBULATORY_CARE_PROVIDER_SITE_OTHER): Payer: Self-pay

## 2023-02-20 DIAGNOSIS — Z111 Encounter for screening for respiratory tuberculosis: Secondary | ICD-10-CM

## 2023-02-20 LAB — TB SKIN TEST
Induration: 0 mm
TB Skin Test: NEGATIVE

## 2023-02-20 NOTE — Telephone Encounter (Signed)
Patient in RN clinic for PPD read and asking about lab results.   Reviewed with Dr. Owens Shark. Provided patient with letter stating that she is immune to varicella, MMR and Hep B.   Provided patient with form completed by Dr. Madison Hickman.   Copy made and placed in batch scanning.   Talbot Grumbling, RN

## 2023-02-20 NOTE — Progress Notes (Signed)
Patient is here for a PPD read.  It was placed on 02/17/2023 in the left forearm @ 2:30 pm.    PPD RESULTS:  Result: negative Induration: 0 mm  Of note, patient had slight redness and bruising at site. Dr. Owens Shark evaluated area, no induration, negative screening.   Letter created and given to patient for documentation purposes.   Patient needs documentation of immunity titers from visit on Friday, 3/8. Spoke with Dr. Owens Shark who upon review, agreed that patient was immune to Hep B, MMR and Varicella and does not require additional vaccination.   Provided this information in letter to patient. Also provided patient with form completed by Dr. Madison Hickman. Copy made and placed in batch scanning.   [place] Interpreter: Junious Dresser, #[id number] used for entire visit.   Talbot Grumbling, RN

## 2024-01-26 ENCOUNTER — Other Ambulatory Visit: Payer: Self-pay | Admitting: Family Medicine

## 2024-01-26 DIAGNOSIS — E039 Hypothyroidism, unspecified: Secondary | ICD-10-CM
